# Patient Record
Sex: Male | Born: 1965
Health system: Southern US, Community
[De-identification: ages and names within clinical notes are randomized; demographics above are authoritative.]

## PROBLEM LIST (undated history)

## (undated) DIAGNOSIS — K219 Gastro-esophageal reflux disease without esophagitis: Secondary | ICD-10-CM

## (undated) DIAGNOSIS — R030 Elevated blood-pressure reading, without diagnosis of hypertension: Secondary | ICD-10-CM

## (undated) DIAGNOSIS — H9190 Unspecified hearing loss, unspecified ear: Secondary | ICD-10-CM

## (undated) DIAGNOSIS — E785 Hyperlipidemia, unspecified: Secondary | ICD-10-CM

## (undated) DIAGNOSIS — E291 Testicular hypofunction: Secondary | ICD-10-CM

## (undated) DIAGNOSIS — N400 Enlarged prostate without lower urinary tract symptoms: Secondary | ICD-10-CM

## (undated) DIAGNOSIS — R079 Chest pain, unspecified: Secondary | ICD-10-CM

## (undated) DIAGNOSIS — I10 Essential (primary) hypertension: Secondary | ICD-10-CM

## (undated) DIAGNOSIS — N411 Chronic prostatitis: Secondary | ICD-10-CM

## (undated) DIAGNOSIS — R454 Irritability and anger: Secondary | ICD-10-CM

## (undated) DIAGNOSIS — A0472 Enterocolitis due to Clostridium difficile, not specified as recurrent: Secondary | ICD-10-CM

## (undated) DIAGNOSIS — B019 Varicella without complication: Secondary | ICD-10-CM

## (undated) DIAGNOSIS — G4762 Sleep related leg cramps: Secondary | ICD-10-CM

## (undated) DIAGNOSIS — M47812 Spondylosis without myelopathy or radiculopathy, cervical region: Secondary | ICD-10-CM

## (undated) DIAGNOSIS — S0510XA Contusion of eyeball and orbital tissues, unspecified eye, initial encounter: Secondary | ICD-10-CM

## (undated) DIAGNOSIS — R9431 Abnormal electrocardiogram [ECG] [EKG]: Secondary | ICD-10-CM

## (undated) DIAGNOSIS — M19019 Primary osteoarthritis, unspecified shoulder: Secondary | ICD-10-CM

## (undated) DIAGNOSIS — G4730 Sleep apnea, unspecified: Secondary | ICD-10-CM

## (undated) DIAGNOSIS — G4700 Insomnia, unspecified: Secondary | ICD-10-CM

## (undated) DIAGNOSIS — J309 Allergic rhinitis, unspecified: Secondary | ICD-10-CM

## (undated) DIAGNOSIS — L658 Other specified nonscarring hair loss: Secondary | ICD-10-CM

## (undated) HISTORY — DX: Varicella without complication: B01.9

## (undated) HISTORY — DX: Chest pain, unspecified: R07.9

## (undated) HISTORY — DX: Gastro-esophageal reflux disease without esophagitis: K21.9

## (undated) HISTORY — DX: Irritability and anger: R45.4

## (undated) HISTORY — DX: Testicular hypofunction: E29.1

## (undated) HISTORY — DX: Allergic rhinitis, unspecified: J30.9

## (undated) HISTORY — DX: Unspecified hearing loss, unspecified ear: H91.90

## (undated) HISTORY — DX: Other specified nonscarring hair loss: L65.8

## (undated) HISTORY — DX: Insomnia, unspecified: G47.00

## (undated) HISTORY — DX: Hyperlipidemia, unspecified: E78.5

## (undated) HISTORY — DX: Elevated blood-pressure reading, without diagnosis of hypertension: R03.0

## (undated) HISTORY — DX: Sleep related leg cramps: G47.62

## (undated) HISTORY — DX: Abnormal electrocardiogram (ECG) (EKG): R94.31

## (undated) HISTORY — DX: Contusion of eyeball and orbital tissues, unspecified eye, initial encounter: S05.10XA

## (undated) HISTORY — DX: Benign prostatic hyperplasia without lower urinary tract symptoms: N40.0

## (undated) HISTORY — DX: Chronic prostatitis: N41.1

## (undated) HISTORY — PX: DENTAL RESTORATION/EXTRACTION WITH X-RAY: SHX5796

---

## 1999-10-19 ENCOUNTER — Encounter: Admission: RE | Admit: 1999-10-19 | Discharge: 1999-10-19 | Payer: Self-pay | Admitting: *Deleted

## 2008-10-22 ENCOUNTER — Emergency Department: Payer: Self-pay | Admitting: Emergency Medicine

## 2009-01-31 HISTORY — PX: TONSILLECTOMY: SUR1361

## 2011-01-23 ENCOUNTER — Ambulatory Visit: Payer: Self-pay | Admitting: Family Medicine

## 2012-02-20 LAB — HEMOGLOBIN A1C: Hgb A1c MFr Bld: 5.4 % (ref 4.0–6.0)

## 2012-02-20 LAB — LIPID PANEL
Cholesterol: 174 mg/dL (ref 0–200)
HDL: 43 mg/dL (ref 35–70)
LDL Cholesterol: 109 mg/dL
Triglycerides: 108 mg/dL (ref 40–160)

## 2012-08-04 ENCOUNTER — Encounter: Payer: Self-pay | Admitting: *Deleted

## 2012-08-06 ENCOUNTER — Encounter: Payer: Self-pay | Admitting: *Deleted

## 2012-08-12 ENCOUNTER — Encounter: Payer: Self-pay | Admitting: Family Medicine

## 2012-08-17 ENCOUNTER — Ambulatory Visit (INDEPENDENT_AMBULATORY_CARE_PROVIDER_SITE_OTHER): Payer: 59 | Admitting: Family Medicine

## 2012-08-17 ENCOUNTER — Encounter: Payer: Self-pay | Admitting: Family Medicine

## 2012-08-17 VITALS — BP 132/90 | HR 68 | Temp 98.2°F | Resp 16 | Ht 71.5 in | Wt 228.0 lb

## 2012-08-17 DIAGNOSIS — Z8042 Family history of malignant neoplasm of prostate: Secondary | ICD-10-CM

## 2012-08-17 DIAGNOSIS — E291 Testicular hypofunction: Secondary | ICD-10-CM

## 2012-08-17 DIAGNOSIS — IMO0001 Reserved for inherently not codable concepts without codable children: Secondary | ICD-10-CM

## 2012-08-17 DIAGNOSIS — N400 Enlarged prostate without lower urinary tract symptoms: Secondary | ICD-10-CM

## 2012-08-17 DIAGNOSIS — F419 Anxiety disorder, unspecified: Secondary | ICD-10-CM

## 2012-08-17 DIAGNOSIS — R030 Elevated blood-pressure reading, without diagnosis of hypertension: Secondary | ICD-10-CM

## 2012-08-17 DIAGNOSIS — F411 Generalized anxiety disorder: Secondary | ICD-10-CM

## 2012-08-17 DIAGNOSIS — G4700 Insomnia, unspecified: Secondary | ICD-10-CM

## 2012-08-17 MED ORDER — CLONAZEPAM 0.5 MG PO TABS: 0.5000 mg | ORAL_TABLET | Freq: Every day | ORAL | Status: DC | PRN

## 2012-08-17 NOTE — Progress Notes (Signed)
[address]   [place], [place]  [address]   [phone]  Subjective:    Patient ID: Tom Wilkerson, male    DOB: November 16, 1966, 46 y.o.   MRN: [id number]  HPIThis 46 y.o. male presents for evaluation of  1.  Anxiety:  Feels much better with 1/4 of Clonazepam 0.5mg  daily.  Has a really short fuse; +anger. Still trying not to take it every day.  Wife notices a profound difference in mood when patient takes daily 1/4 Clonazepam.  Wife really wants pt taking med daily.   2. Insomnia: Intolerant to Unisom after last visit.  Sleeps really well with 1/4 of Clonazepam in evenings.   If takes 1/4 Clonazepam at night and then takes 1/4 the following day, a complete Zombie.    3.  Hypogonadism:  feels completely different on injections than pellets.  Severe fatigue without testosterone.  Needs syringes 3 ml x 3.  PSA 1.1 04/2012.  Followed closely by [facility].  4.  Blood pressure elevated:  Sweats excessively; blood pressure at Cope's office runs 170/100.  Gets very nervous at [facility]ope's office.  Associates his office with tremendous pain with pellet injections.  Not exercising over the summer.  Not taking ASA.  5.  Vitamin D deficiency: not taking vitamin D supplement ad advised after CPE in 02/2012.    Review of Systems  Constitutional: Negative for fever, chills, diaphoresis and fatigue.  Respiratory: Negative for shortness of breath.   Cardiovascular: Negative for chest pain, palpitations and leg swelling.  Gastrointestinal: Negative for nausea, vomiting and diarrhea.  Genitourinary: Positive for frequency.  Psychiatric/Behavioral: Positive for disturbed wake/sleep cycle. Negative for suicidal ideas, behavioral problems, self-injury, dysphoric mood, decreased concentration and agitation. The patient is nervous/anxious.         Past Medical History  Diagnosis Date  . Insomnia, unspecified   . Nonspecific abnormal electrocardiogram (ECG) (EKG)   . Unspecified hearing loss   . Other and unspecified  hyperlipidemia   . Esophageal reflux   . Chest pain, unspecified   . Unspecified contusion of eye   . Irritability   . Sleep related leg cramps   . Chronic prostatitis   . Unspecified hyperplasia of prostate without urinary obstruction and other lower urinary tract symptoms (LUTS)   . Other alopecia   . Allergic rhinitis, cause unspecified   . Elevated blood pressure reading without diagnosis of hypertension   . Other testicular hypofunction   . Chicken pox     Past Surgical History  Procedure Date  . Tonsillectomy 01/31/2009    Willeen Cass    Prior to Admission medications   Medication Sig Start Date End Date Taking? Authorizing Provider  budesonide (RHINOCORT AQUA) 32 MCG/ACT nasal spray Place 2 sprays into the nose daily.   Yes Historical Provider, MD  clonazePAM (KLONOPIN) 0.5 MG tablet Take 0.5 mg by mouth daily as needed.   Yes Historical Provider, MD  EPINEPHrine (EPI-PEN) 0.3 mg/0.3 mL DEVI Inject 0.3 mg into the muscle once.   Yes Historical Provider, MD  metaxalone (SKELAXIN) 800 MG tablet Take 800 mg by mouth 4 (four) times daily as needed.   Yes Historical Provider, MD  Tamsulosin HCl (FLOMAX) 0.4 MG CAPS Take by mouth.   Yes Historical Provider, MD  testosterone cypionate (DEPOTESTOTERONE CYPIONATE) 100 MG/ML injection Inject into the muscle every 14 (fourteen) days. For IM use only   Yes Historical Provider, MD    Allergies  Allergen Reactions  . Bee Venom  Bee Stings   . Codeine     Insomnia and Hyperactivity  . Ibuprofen Itching and Swelling    History   Social History  . Marital Status: Married    Spouse Name: N/A    Number of Children: N/A  . Years of Education: N/A   Occupational History  . works from [employer] in [employer] x 12 years; happy   Social History Main Topics  . Smoking status: Never Smoker   . Smokeless tobacco: Not on file  . Alcohol Use: Yes     occasional socially twice weekly beer  . Drug Use: No    . Sexually Active: Not on file   Other Topics Concern  . Not on file   Social History Narrative   Always uses seat belts. Smoke alarm and carbon monoxide detector in the home.Guns in the home stored in locked cabinet. Caffeine use Large more than 8 servings per day. Married x 23 years, happily married,no children.Nutrtion Poorly balanced diet. Exercise: moderate 3 x week, walking,cycling. Pets: cat, dog    Family History  Problem Relation Age of Onset  . Cancer      malignancy prostate  . Hypertension Mother   . Cancer Mother     lung and kidney  . Depression Mother   . Hyperlipidemia Mother   . Arthritis Mother   . Migraines Sister   . Heart disease Father     CAD CABG age 54  . Hypertension Father   . Stroke Father   . Hyperlipidemia Father   . Cancer Father     prostate  . Liver disease Brother     on liver transplant list  . Ulcerative colitis Brother     Objective:   Physical Exam  Nursing note and vitals reviewed. Constitutional: He is oriented to person, place, and time. He appears well-developed and well-nourished. No distress.  Eyes: Conjunctivae normal are normal. Pupils are equal, round, and reactive to light.  Neck: Normal range of motion. Neck supple.  Cardiovascular: Normal rate, regular rhythm and normal heart sounds.   Pulmonary/Chest: Effort normal and breath sounds normal.  Neurological: He is alert and oriented to person, place, and time. No cranial nerve deficit. He exhibits normal muscle tone.  Skin: He is not diaphoretic.  Psychiatric: He has a normal mood and affect. His behavior is normal. Judgment and thought content normal.       Assessment & Plan:   1. Insomnia  clonazePAM (KLONOPIN) 0.5 MG tablet  2. Anxiety  clonazePAM (KLONOPIN) 0.5 MG tablet  3. Blood pressure elevated    4. BPH (benign prostatic hyperplasia)    5. Hypogonadism male    6. Family history of prostate cancer       1. Insomnia:  Controlled with nightly Clonazepam  0.5mg  1/4 daily.  No changes to management. 2.  Anxiety: Improved with 1/4 tablet of daily Clonazepam 0.5mg ; discussed habit forming nature of medication and agreeable to continuing at this time due to low dose needed. 3.  Blood pressure elevated: persistent; to start checking blood pressure weekly at home.  Also to restart exercise regimen. 4.  BPH: stable but warranting Flomax almost daily; managed by urology. 5.  Hypogonadism: stable despite switch from pellets to injections; per urology. 6. Family history of prostate cancer: followed by urology. 7. Vitamin D deficiency: uncontrolled.  Non-compliant with daily supplement.  FACE TO FACE TIME SPENT WITH PATIENT 30 MINUTES WITH 50% OF  TIME DEDICATED TO COUNSELING, COORDINATION OF CARE.

## 2012-08-17 NOTE — Patient Instructions (Addendum)
1. Routine general medical examination at a health care facility    2. Insomnia  clonazePAM (KLONOPIN) 0.5 MG tablet  3. Anxiety  clonazePAM (KLONOPIN) 0.5 MG tablet  4. Blood pressure elevated       CHECK BLOOD PRESSURE WEEKLY AND KEEP RECORD OF READINGS.

## 2012-08-19 ENCOUNTER — Encounter: Payer: Self-pay | Admitting: Family Medicine

## 2012-08-19 DIAGNOSIS — F419 Anxiety disorder, unspecified: Secondary | ICD-10-CM | POA: Insufficient documentation

## 2012-08-19 DIAGNOSIS — Z8042 Family history of malignant neoplasm of prostate: Secondary | ICD-10-CM | POA: Insufficient documentation

## 2012-08-19 DIAGNOSIS — G4700 Insomnia, unspecified: Secondary | ICD-10-CM | POA: Insufficient documentation

## 2012-08-19 DIAGNOSIS — R030 Elevated blood-pressure reading, without diagnosis of hypertension: Secondary | ICD-10-CM | POA: Insufficient documentation

## 2012-08-19 DIAGNOSIS — E291 Testicular hypofunction: Secondary | ICD-10-CM | POA: Insufficient documentation

## 2012-08-19 DIAGNOSIS — N400 Enlarged prostate without lower urinary tract symptoms: Secondary | ICD-10-CM | POA: Insufficient documentation

## 2012-09-16 NOTE — Progress Notes (Signed)
Reviewed and agree.

## 2012-10-14 ENCOUNTER — Telehealth: Payer: Self-pay

## 2012-10-14 NOTE — Telephone Encounter (Signed)
Call back --- Mercie Eon, MD of [facility].  Phone number was not on consult note or fax sheet.  Should be able to call [facility] for phone number.  KMS

## 2012-10-14 NOTE — Telephone Encounter (Signed)
Pt wife calling stating that the patient has a severe headache and was told from past diagnosis that if the pain became a certain number that they needed to call [facility]-they can not remember the name of the person at [facility] that dr Bertrum Sol sent him to originally.  Please call [phone]

## 2012-10-14 NOTE — Telephone Encounter (Signed)
Pt wife advised 

## 2012-10-24 ENCOUNTER — Ambulatory Visit (INDEPENDENT_AMBULATORY_CARE_PROVIDER_SITE_OTHER): Payer: 59 | Admitting: Family Medicine

## 2012-10-24 ENCOUNTER — Ambulatory Visit: Payer: 59

## 2012-10-24 ENCOUNTER — Encounter: Payer: Self-pay | Admitting: Family Medicine

## 2012-10-24 VITALS — BP 117/82 | HR 86 | Temp 98.6°F | Resp 18 | Ht 72.5 in | Wt 221.0 lb

## 2012-10-24 DIAGNOSIS — R059 Cough, unspecified: Secondary | ICD-10-CM

## 2012-10-24 DIAGNOSIS — R51 Headache: Secondary | ICD-10-CM

## 2012-10-24 DIAGNOSIS — M542 Cervicalgia: Secondary | ICD-10-CM

## 2012-10-24 DIAGNOSIS — R509 Fever, unspecified: Secondary | ICD-10-CM

## 2012-10-24 DIAGNOSIS — R079 Chest pain, unspecified: Secondary | ICD-10-CM

## 2012-10-24 DIAGNOSIS — R05 Cough: Secondary | ICD-10-CM

## 2012-10-24 DIAGNOSIS — R0781 Pleurodynia: Secondary | ICD-10-CM

## 2012-10-24 LAB — POCT CBC
Granulocyte percent: 96.2 %G — AB (ref 37–80)
MCH, POC: 29.3 pg (ref 27–31.2)
MCV: 92 fL (ref 80–97)
MID (cbc): 0.5 (ref 0–0.9)
MPV: 9.2 fL (ref 0–99.8)
POC LYMPH PERCENT: 22.3 %L (ref 10–50)
POC MID %: 8.6 %M (ref 0–12)
Platelet Count, POC: 330 10*3/uL (ref 142–424)
RDW, POC: 13.7 %
WBC: 5.8 10*3/uL (ref 4.6–10.2)

## 2012-10-24 LAB — POCT SEDIMENTATION RATE: POCT SED RATE: 2 mm/hr (ref 0–22)

## 2012-10-24 LAB — COMPREHENSIVE METABOLIC PANEL
BUN: 12 mg/dL (ref 6–23)
CO2: 28 mEq/L (ref 19–32)
Calcium: 9.9 mg/dL (ref 8.4–10.5)
Chloride: 99 mEq/L (ref 96–112)
Creat: 1.46 mg/dL — ABNORMAL HIGH (ref 0.50–1.35)

## 2012-10-24 MED ORDER — PREDNISONE 20 MG PO TABS: ORAL_TABLET | ORAL | Status: DC

## 2012-10-24 MED ORDER — CYCLOBENZAPRINE HCL 5 MG PO TABS: 5.0000 mg | ORAL_TABLET | Freq: Three times a day (TID) | ORAL | Status: DC | PRN

## 2012-10-24 NOTE — Progress Notes (Signed)
[address]   [place], [place]  [address]   [phone]  Subjective:    Patient ID: Tom Wilkerson, male    DOB: Sep 27, 1966, 46 y.o.   MRN: [id number]  HPIThis 46 y.o. male presents for evaluation of headache, cold symptoms.  The week before Thanksgiving had horrible sore throat.  ST severe; started coughing dry hacky like crazy.  Coughed so bad one time started retching. Evaluated by Marion Downer; placed on Amoxicillin.  Then headache developed.  Now having muscle pains and stiffness in neck.  Sleeping in odd places.  Muscles in back hurting from coughing.  Unable to sleep at all for 4-5 days.  Severity 2-5 HA.  Headache less than 1 currently.  Scalp has been very sensitive to touch.  Moving really slowly due to back pain.  Scott gave Tramadol for headache.  Taking Tylenol without relief of headache.  No fever with onset;+hot and cold chills; onset this week; four days ago.  HA located B temple regions.  Turning head makes headache worse.  No photophobia.  No phonophobia.  +nausea this morning and this week.  No appetite.  No vomiting.  No diarrhea.  +rhinorrhea x 1 week. Clear drainage.  No nasal congestion.  Cough horrible started three weeks ago; mild cough but much improved.  No SOB.  Having a hard time taking a deep breath due to back pain.  Onset of headache one week ago.  Taking Tramadol which seems to help with pain; Tramadol every 8 hours; last dose last night at 6:00.  Did take Clonazepam this morning.  No blurred vision; no dizziness; no confusion or slowed thoughts.  No syncope.  No n/t/w.  No tick bites in past month.  History of viral meningitis 09/2009 with exacerbation in 01/2010 after tonsillectomy.  S/p consultation by [facility].  Headache feels just like meningitis headache but not as severe.  Very worried about similar headache.  Coughing makes headache worse but straining for B.M. does not worsen headache.  Called neurology last week with onset of headache; would not make  appointment due to time lapse from last visit.  No flu vaccine.     Review of Systems  Constitutional: Positive for fever, chills, diaphoresis and fatigue.  HENT: Positive for congestion, sore throat, rhinorrhea, neck pain, neck stiffness and postnasal drip. Negative for ear pain, trouble swallowing, voice change and sinus pressure.   Respiratory: Positive for cough. Negative for chest tightness, shortness of breath, wheezing and stridor.   Gastrointestinal: Positive for nausea. Negative for vomiting, abdominal pain and diarrhea.  Skin: Negative for rash.  Neurological: Positive for headaches. Negative for dizziness, tremors, seizures, syncope, facial asymmetry, speech difficulty, weakness, light-headedness and numbness.  Psychiatric/Behavioral: Positive for sleep disturbance. Negative for confusion. The patient is nervous/anxious.         Past Medical History  Diagnosis Date  . Insomnia, unspecified   . Nonspecific abnormal electrocardiogram (ECG) (EKG)   . Unspecified hearing loss   . Other and unspecified hyperlipidemia   . Esophageal reflux   . Chest pain, unspecified   . Unspecified contusion of eye   . Irritability   . Sleep related leg cramps   . Chronic prostatitis   . Unspecified hyperplasia of prostate without urinary obstruction and other lower urinary tract symptoms (LUTS)   . Other alopecia   . Allergic rhinitis, cause unspecified   . Elevated blood pressure reading without diagnosis of hypertension   . Other testicular hypofunction   .  Chicken pox     Past Surgical History  Procedure Date  . Tonsillectomy 01/31/2009    Willeen Cass    Prior to Admission medications   Medication Sig Start Date End Date Taking? Authorizing Provider  amoxicillin (AMOXIL) 200 MG/5ML suspension Take by mouth 2 (two) times daily.   Yes Historical Provider, MD  budesonide (RHINOCORT AQUA) 32 MCG/ACT nasal spray Place 2 sprays into the nose daily.   Yes Historical Provider, MD    clonazePAM (KLONOPIN) 0.5 MG tablet Take 1 tablet (0.5 mg total) by mouth daily as needed. 08/17/12  Yes Ethelda Chick, MD  EPINEPHrine (EPI-PEN) 0.3 mg/0.3 mL DEVI Inject 0.3 mg into the muscle once.   Yes Historical Provider, MD  metaxalone (SKELAXIN) 800 MG tablet Take 800 mg by mouth 4 (four) times daily as needed.   Yes Historical Provider, MD  Tamsulosin HCl (FLOMAX) 0.4 MG CAPS Take by mouth.   Yes Historical Provider, MD  testosterone cypionate (DEPOTESTOTERONE CYPIONATE) 100 MG/ML injection Inject into the muscle every 14 (fourteen) days. For IM use only   Yes Historical Provider, MD    Allergies  Allergen Reactions  . Bee Venom     Bee Stings   . Codeine     Insomnia and Hyperactivity  . Ibuprofen Itching and Swelling    History   Social History  . Marital Status: Married    Spouse Name: N/A    Number of Children: N/A  . Years of Education: N/A   Occupational History  . works from [employer] company in [employer] x 12 years; happy   Social History Main Topics  . Smoking status: Never Smoker   . Smokeless tobacco: Not on file  . Alcohol Use: Yes     Comment: occasional socially twice weekly beer  . Drug Use: No  . Sexually Active: Not on file   Other Topics Concern  . Not on file   Social History Narrative   Always uses seat belts. Smoke alarm and carbon monoxide detector in the home.Guns in the home stored in locked cabinet. Caffeine use Large more than 8 servings per day. Married x 23 years, happily married,no children.Nutrtion Poorly balanced diet. Exercise: moderate 3 x week, walking,cycling. Pets: cat, dog    Family History  Problem Relation Age of Onset  . Cancer      malignancy prostate  . Hypertension Mother   . Cancer Mother     lung and kidney  . Depression Mother   . Hyperlipidemia Mother   . Arthritis Mother   . Migraines Sister   . Heart disease Father     CAD CABG age 50  . Hypertension Father   . Stroke Father   .  Hyperlipidemia Father   . Cancer Father     prostate  . Liver disease Brother     on liver transplant list  . Ulcerative colitis Brother     Objective:   Physical Exam  Nursing note and vitals reviewed. Constitutional: He is oriented to person, place, and time. He appears well-developed and well-nourished. No distress.  HENT:  Head: Normocephalic and atraumatic.  Right Ear: External ear normal.  Left Ear: External ear normal.  Nose: Nose normal.  Mouth/Throat: Oropharynx is clear and moist. No oropharyngeal exudate.  Eyes: Conjunctivae normal and EOM are normal. Pupils are equal, round, and reactive to light.  Neck: Neck supple. Muscular tenderness present. No spinous process tenderness present. No rigidity. Decreased range  of motion present. No thyromegaly present.  Cardiovascular: Normal rate, regular rhythm and normal heart sounds.   No murmur heard. Pulmonary/Chest: Effort normal and breath sounds normal. He has no wheezes. He has no rales.  Musculoskeletal:       Cervical back: He exhibits decreased range of motion, tenderness, pain and spasm. He exhibits no bony tenderness and no swelling.       Thoracic back: He exhibits decreased range of motion, tenderness, pain and spasm. He exhibits no bony tenderness.  Lymphadenopathy:    He has no cervical adenopathy.  Neurological: He is alert and oriented to person, place, and time. He has normal reflexes. He displays no tremor. No cranial nerve deficit or sensory deficit. He exhibits normal muscle tone. Coordination normal.       No nuchal rigidity.  Skin: He is not diaphoretic.  Psychiatric: He has a normal mood and affect. His behavior is normal. Judgment and thought content normal.    [facility] reading (PRIMARY) by  Dr. Katrinka Blazing.  CXR: NAD.  C-spine: NAD; straightening; loss of normal curvature.  R ribs: NAD.  Results for orders placed in visit on 10/24/12  POCT CBC      Component Value Range   WBC 5.8  4.6 - 10.2 K/uL   Lymph, poc  1.3  0.6 - 3.4   POC LYMPH PERCENT 22.3  10 - 50 %L   MID (cbc) 0.5  0 - 0.9   POC MID % 8.6  0 - 12 %M   POC Granulocyte 4.0  2 - 6.9   Granulocyte percent 96.2 (*) 37 - 80 %G   RBC 6.15 (*) 4.69 - 6.13 M/uL   Hemoglobin 18.0  14.1 - 18.1 g/dL   HCT, POC 40.9 (*) 81.1 - 53.7 %   MCV 92.0  80 - 97 fL   MCH, POC 29.3  27 - 31.2 pg   MCHC 31.8  31.8 - 35.4 g/dL   RDW, POC 91.4     Platelet Count, POC 330  142 - 424 K/uL   MPV 9.2  0 - 99.8 fL  GLUCOSE, POCT (MANUAL RESULT ENTRY)      Component Value Range   POC Glucose 91  70 - 99 mg/dl  POCT INFLUENZA A/B      Component Value Range   Influenza A, POC Negative     Influenza B, POC Negative         Assessment & Plan:   1. Fever  POCT CBC, POCT Influenza A/B  2. Cough  POCT CBC, POCT Influenza A/B, POCT SEDIMENTATION RATE, DG Chest 2 View  3. Headache  POCT glucose (manual entry), Comprehensive metabolic panel, Ambulatory referral to Neurology  4. Neck pain  DG Cervical Spine 2-3 Views  5. Rib pain on right side  DG Ribs Unilateral W/Chest Right     1. Fever: New onset in past week.  Influenza swab negative; WBC count normal consistent with viral syndrome.  CXR negative. 2.  Cough:  Onset two weeks ago; 75% improved.  CXR negative. 3.  Headache: New onset in past week. Normal neurological exam.  History of viral meningitis in 2010 and current headache milder but similar to meningitis mediated HA.  Refer to neurology now for evaluation in upcoming 2-3 weeks if persists.  Can continue Ultram 50mg  1-2 every eight hours PRN.  Ddx viral etiology, early viral meningitis, neurological process.  If worsens, to ED.  Continue Skelaxin during daytime hours; Flexeril qhs.   4.  Neck pain/strain:  New.  Associated with above symptoms.  S/p C-spine films negative.  Treat with Prednisone, Skelaxin, Flexeril, Ultram.   5.  R rib pain/strain:  New.  Secondary to cough in past two weeks.  Rx for Prednisone, Flexeril, Skelaxin, Ultram.    Meds  ordered this encounter  Medications  . amoxicillin (AMOXIL) 200 MG/5ML suspension    Sig: Take by mouth 2 (two) times daily.  . cyclobenzaprine (FLEXERIL) 5 MG tablet    Sig: Take 1 tablet (5 mg total) by mouth 3 (three) times daily as needed for muscle spasms.    Dispense:  30 tablet    Refill:  0  . predniSONE (DELTASONE) 20 MG tablet    Sig: Three tablets daily x 1 day, then two tablets daily x 5 days, then one tablet daily x 5 days    Dispense:  18 tablet    Refill:  0

## 2012-10-24 NOTE — Patient Instructions (Addendum)
1. Fever  POCT CBC, POCT Influenza A/B  2. Cough  POCT CBC, POCT Influenza A/B, POCT SEDIMENTATION RATE, DG Chest 2 View  3. Headache  POCT glucose (manual entry), Comprehensive metabolic panel, Ambulatory referral to Neurology  4. Neck pain  DG Cervical Spine 2-3 Views  5. Rib pain on right side  DG Ribs Unilateral W/Chest Right

## 2012-10-26 NOTE — Progress Notes (Signed)
Reviewed and agree.

## 2012-11-02 NOTE — Progress Notes (Signed)
Tried to call pt to schedule f-up appt with Dr. Katrinka Blazing. Cell # has been disconnected and home# has no machine. Will continue trying.Tom Wilkerson

## 2012-11-04 NOTE — Progress Notes (Signed)
Still unable to reach patient by phone, reminder letter sent to schedule follow-up appt with Dr. Katrinka Blazing. Tom Wilkerson

## 2013-01-18 ENCOUNTER — Other Ambulatory Visit: Payer: Self-pay | Admitting: Radiology

## 2013-01-18 NOTE — Telephone Encounter (Signed)
[facility] called for WC information called [facility] and they have helped with this.

## 2013-01-19 MED ORDER — CYCLOBENZAPRINE HCL 5 MG PO TABS: 5.0000 mg | ORAL_TABLET | Freq: Three times a day (TID) | ORAL | Status: DC | PRN

## 2013-02-10 ENCOUNTER — Telehealth: Payer: Self-pay

## 2013-02-10 NOTE — Telephone Encounter (Signed)
Pharm requests RF of cyclobenzaprine 5 mg. Do you want to RF this?

## 2013-02-11 MED ORDER — CYCLOBENZAPRINE HCL 5 MG PO TABS: 5.0000 mg | ORAL_TABLET | Freq: Three times a day (TID) | ORAL | Status: DC | PRN

## 2013-02-11 NOTE — Telephone Encounter (Signed)
Call pt-- provided with refill on Flexeril.  How often is he needing Flexeril?  Is it for persistent neck pain?

## 2013-02-11 NOTE — Telephone Encounter (Signed)
He is taking this for his head and neck, tension headache this completely relieves his pain. He only takes prn.

## 2013-02-11 NOTE — Telephone Encounter (Signed)
Called patient. Left message for him to call me back.

## 2013-02-11 NOTE — Telephone Encounter (Signed)
Noted. KMS

## 2013-03-22 ENCOUNTER — Encounter: Payer: 59 | Admitting: Family Medicine

## 2013-08-03 ENCOUNTER — Encounter: Payer: Self-pay | Admitting: *Deleted

## 2013-08-09 ENCOUNTER — Institutional Professional Consult (permissible substitution): Payer: Self-pay | Admitting: Cardiology

## 2013-09-03 ENCOUNTER — Encounter: Payer: Self-pay | Admitting: *Deleted

## 2013-09-07 ENCOUNTER — Ambulatory Visit (INDEPENDENT_AMBULATORY_CARE_PROVIDER_SITE_OTHER): Payer: 59 | Admitting: Cardiology

## 2013-09-07 ENCOUNTER — Encounter: Payer: Self-pay | Admitting: Cardiology

## 2013-09-07 VITALS — BP 142/80 | HR 78 | Wt 218.0 lb

## 2013-09-07 DIAGNOSIS — R079 Chest pain, unspecified: Secondary | ICD-10-CM

## 2013-09-07 LAB — COMPREHENSIVE METABOLIC PANEL
ALT: 19 U/L (ref 0–53)
AST: 18 U/L (ref 0–37)
Albumin: 4.2 g/dL (ref 3.5–5.2)
Alkaline Phosphatase: 73 U/L (ref 39–117)
BUN: 13 mg/dL (ref 6–23)
CO2: 31 mEq/L (ref 19–32)
Calcium: 9.1 mg/dL (ref 8.4–10.5)
Chloride: 99 mEq/L (ref 96–112)
Creatinine, Ser: 1.3 mg/dL (ref 0.4–1.5)
GFR: 61.44 mL/min (ref >60.00)
Glucose, Bld: 98 mg/dL (ref 70–99)
Potassium: 4.1 mEq/L (ref 3.5–5.1)
Sodium: 138 mEq/L (ref 135–145)
Total Bilirubin: 0.7 mg/dL (ref 0.3–1.2)
Total Protein: 7 g/dL (ref 6.0–8.3)

## 2013-09-07 NOTE — Progress Notes (Signed)
Patient ID: JYLAN LOEZA, male   DOB: 08-Jan-1966, 47 y.o.   MRN: [id number]    Patient Name: Tom Wilkerson Date of Encounter: 09/07/2013  Primary Care Provider:  Vonita Moss, MD Primary Cardiologist:  Tobias Alexander, H   Patient Profile  Chest  Problem List   Past Medical History  Diagnosis Date  . Insomnia, unspecified   . Nonspecific abnormal electrocardiogram (ECG) (EKG)   . Unspecified hearing loss   . Other and unspecified hyperlipidemia   . Esophageal reflux   . Chest pain, unspecified   . Unspecified contusion of eye   . Irritability   . Sleep related leg cramps   . Chronic prostatitis   . Unspecified hyperplasia of prostate without urinary obstruction and other lower urinary tract symptoms (LUTS)   . Other alopecia   . Allergic rhinitis, cause unspecified   . Elevated blood pressure reading without diagnosis of hypertension   . Other testicular hypofunction   . Chicken pox    Past Surgical History  Procedure Laterality Date  . Tonsillectomy  01/31/2009    [name]    Allergies  Allergies  Allergen Reactions  . Bee Venom     Bee Stings   . Codeine     Insomnia and Hyperactivity  . Ibuprofen Itching and Swelling    HPI  A 47 year old gentleman with uncontrolled hypertension he was a former avid biker. The patient used to try to bike for 5016 miles until 10 months ago when he injured his back. In the last 3 months he started to experience nonexertional retrosternal chest pain. The pain is not associated with any aggravating situation, no obvious alleviating factors. The pain is pressure-like retrosternal no radiation no other associated symptoms. The patient was seen by his primary care physician who prescribed Zantac that gave him no relief. The patient underwent stress testing in  about 4 years ago that was negative at the time. The patient brings a blood pressure diary with him. He was recently started on lisinopril 5 mg daily. He is  currently being treated for sinus infection with multiple medication containing ephedrine and so his blood pressure are elevated in 140-150 range. His lisinopril was just increased to 10 mg daily.  Home Medications  Prior to Admission medications   Medication Sig Start Date End Date Taking? Authorizing Provider  budesonide (RHINOCORT AQUA) 32 MCG/ACT nasal spray Place 2 sprays into the nose daily.   Yes Historical Provider, MD  cefdinir (OMNICEF) 300 MG capsule As directed 09/02/13  Yes Historical Provider, MD  clonazePAM (KLONOPIN) 0.5 MG tablet Take 1 tablet (0.5 mg total) by mouth daily as needed. 08/17/12  Yes Ethelda Chick, MD  cyclobenzaprine (FLEXERIL) 5 MG tablet Take 1 tablet (5 mg total) by mouth 3 (three) times daily as needed for muscle spasms. 02/11/13  Yes Ethelda Chick, MD  EPINEPHrine (EPI-PEN) 0.3 mg/0.3 mL DEVI Inject 0.3 mg into the muscle once.   Yes Historical Provider, MD  lisinopril (PRINIVIL,ZESTRIL) 5 MG tablet Take 2 tablets by mouth daily. 07/26/13  Yes Historical Provider, MD  metaxalone (SKELAXIN) 800 MG tablet Take 800 mg by mouth 4 (four) times daily as needed.   Yes Historical Provider, MD  pravastatin (PRAVACHOL) 20 MG tablet Take 1 tablet by mouth daily. 07/27/13  Yes Historical Provider, MD  predniSONE (DELTASONE) 20 MG tablet Three tablets daily x 1 day, then two tablets daily x 5 days, then one tablet daily x 5 days 10/24/12  Yes Ethelda Chick,  MD  Tamsulosin HCl (FLOMAX) 0.4 MG CAPS Take by mouth.   Yes Historical Provider, MD  testosterone cypionate (DEPOTESTOTERONE CYPIONATE) 100 MG/ML injection Inject into the muscle every 14 (fourteen) days. For IM use only   Yes Historical Provider, MD    Family History  Family History  Problem Relation Age of Onset  . Cancer      malignancy prostate  . Hypertension Mother   . Lung cancer Mother   . Depression Mother   . Hyperlipidemia Mother   . Arthritis Mother   . Migraines Sister   . CAD Father     CABG age  64  . Hypertension Father   . Stroke Father   . Hyperlipidemia Father   . Prostate cancer Father   . Liver disease Brother     on liver transplant list  . Ulcerative colitis Brother   . Kidney cancer Mother   . Congestive Heart Failure Father     Social History  History   Social History  . Marital Status: Married    Spouse Name: N/A    Number of Children: N/A  . Years of Education: N/A   Occupational History  . works from [employer] company in [employer] x 12 years; happy   Social History Main Topics  . Smoking status: Never Smoker   . Smokeless tobacco: Not on file  . Alcohol Use: Yes     Comment: occasional socially twice weekly beer  . Drug Use: No  . Sexual Activity: Not on file   Other Topics Concern  . Not on file   Social History Narrative   Always uses seat belts. Smoke alarm and carbon monoxide detector in the home.Guns in the home stored in locked cabinet. Caffeine use Large more than 8 servings per day. Married x 23 years, happily married,no children.Nutrtion Poorly balanced diet. Exercise: moderate 3 x week, walking,cycling. Pets: cat, dog     Review of Systems General:  No chills, fever, night sweats or weight changes.  Cardiovascular:  No chest pain, dyspnea on exertion, edema, orthopnea, palpitations, paroxysmal nocturnal dyspnea. Dermatological: No rash, lesions/masses Respiratory: No cough, dyspnea Urologic: No hematuria, dysuria Abdominal:   No nausea, vomiting, diarrhea, bright red blood per rectum, melena, or hematemesis Neurologic:  No visual changes, wkns, changes in mental status. All other systems reviewed and are otherwise negative except as noted above.  Physical Exam  Blood pressure 142/80, pulse 78, weight 218 lb (98.884 kg).  General: Pleasant, NAD Psych: Normal affect. Neuro: Alert and oriented X 3. Moves all extremities spontaneously. HEENT: Normal  Neck: Supple without bruits or JVD. Lungs:  Resp regular and  unlabored, CTA. Heart: RRR no s3, s4, or murmurs. Abdomen: Soft, non-tender, non-distended, BS + x 4.  Extremities: No clubbing, cyanosis or edema. DP/PT/Radials 2+ and equal bilaterally.  Accessory Clinical Findings  ECG - normal sinus rhythm, rightward axis, normal EKG   Assessment & Plan  47 year old male with hypertension   1. Chest pain - the patient chest pain is rather atypical, he has trace factors including hypertension hyperlipidemia as well as family history. His father and underwent five-vessel bypass at the age of 50. We will order cardiac CT to evaluate his coronary arteries. It will include a calcium score. Check patient's comprehensive metabolic profile to evaluate for his liver enzymes as he was recently started on statins as well as for his kidney function prior to use of IV contrast.  2. Hypertension-  agree with increasing her lisinopril to 10 mg daily and re-evaluate in 3 weeks when patient comes back when he's off efedrin.  3. Hyperlipidemia - followed by PCP - recently started on Pravastatin, we will check liver enzymes  Follow up in 3 weeks   Lars Masson, MD  09/07/2013, 12:09 PM

## 2013-09-07 NOTE — Patient Instructions (Signed)
Lab work today ( CMP )   Schedule cardiac ct with a calcium score  Dr.Nelson wants patient to have done 09/16/13.    Your physician recommends that you schedule a follow-up appointment in: 3 weeks

## 2013-09-08 ENCOUNTER — Encounter: Payer: Self-pay | Admitting: *Deleted

## 2013-09-13 ENCOUNTER — Ambulatory Visit: Payer: Self-pay | Admitting: Family Medicine

## 2013-09-14 ENCOUNTER — Other Ambulatory Visit (HOSPITAL_COMMUNITY): Payer: 59

## 2013-09-16 ENCOUNTER — Ambulatory Visit (HOSPITAL_COMMUNITY): Admission: RE | Admit: 2013-09-16 | Payer: 59 | Source: Ambulatory Visit

## 2013-09-24 ENCOUNTER — Telehealth: Payer: Self-pay | Admitting: Cardiology

## 2013-09-24 NOTE — Telephone Encounter (Signed)
New problem   Is needing nurse to call pt with results of his stress test and his creatine on level 09/15/13 was 1.23.

## 2013-09-24 NOTE — Telephone Encounter (Signed)
Chart was reviewed/ please read lab note dated 09/07/13 [facility]. Will forward to Dr Delton See to call or advise.

## 2013-09-26 NOTE — Telephone Encounter (Signed)
HI Tom Wilkerson, This patient has elevated Crea and therefore cardiac CT is not ideal. I would rather perform an exercise MIBI (nuclear) test. He called on Friday about results of his stress test, but I don't think that it was done. Could you verify and order a new test? I would also change his Lisinopril to amlodipine 5 mg PO daily. Would you call him? Thank you so much, Aris Lot

## 2013-09-27 NOTE — Telephone Encounter (Signed)
**Note De-Identified Tom Wilkerson Obfuscation** Pt is requesting a call from Dr Delton See to discuss tests.  The pt states that he has had a cold for the past month and he feels that may be why his creatine level is high at 1.46. He is concerned that a exercise stress test will cost more than a cardiac CT.

## 2013-09-27 NOTE — Telephone Encounter (Signed)
**Note De-identified Tom Wilkerson Obfuscation** LMTCB

## 2013-09-27 NOTE — Telephone Encounter (Signed)
Almira Coaster,  Would you be able to find out the cost for the cardiac CT and nuclear stress test and call this patient [facility])?  Thank you,  Tobias Alexander

## 2013-09-28 ENCOUNTER — Other Ambulatory Visit: Payer: Self-pay | Admitting: Urgent Care

## 2013-09-28 NOTE — Telephone Encounter (Signed)
**Note De-Identified Tom Wilkerson Obfuscation** Pt request a call from Dr Delton See.

## 2013-09-29 LAB — CLOSTRIDIUM DIFFICILE(ARMC)

## 2013-09-30 NOTE — Telephone Encounter (Signed)
I will forward this to Danielle Dess in [employer]

## 2013-09-30 NOTE — Telephone Encounter (Signed)
This needs to be referred to Billing. I will forward this on. Thanks!

## 2013-10-04 ENCOUNTER — Other Ambulatory Visit: Payer: Self-pay | Admitting: Urgent Care

## 2013-10-04 ENCOUNTER — Ambulatory Visit: Payer: 59 | Admitting: Cardiology

## 2013-10-04 LAB — URINALYSIS, COMPLETE
Bacteria: NONE SEEN
Bilirubin,UR: NEGATIVE
Blood: NEGATIVE
Glucose,UR: NEGATIVE mg/dL (ref 0–75)
Leukocyte Esterase: NEGATIVE
Nitrite: NEGATIVE
Ph: 7 (ref 4.5–8.0)
WBC UR: <1 /HPF (ref 0–5)

## 2013-10-04 LAB — COMPREHENSIVE METABOLIC PANEL
Anion Gap: 2 — ABNORMAL LOW (ref 7–16)
BUN: 5 mg/dL — ABNORMAL LOW (ref 7–18)
Bilirubin,Total: 0.5 mg/dL (ref 0.2–1.0)
Chloride: 100 mmol/L (ref 98–107)
Co2: 35 mmol/L — ABNORMAL HIGH (ref 21–32)
EGFR (African American): >60
EGFR (Non-African Amer.): >60
SGPT (ALT): 15 U/L (ref 12–78)
Total Protein: 6.9 g/dL (ref 6.4–8.2)

## 2013-10-04 LAB — CBC WITH DIFFERENTIAL/PLATELET
Basophil %: 1.2 %
Eosinophil #: 0.2 10*3/uL (ref 0.0–0.7)
Eosinophil %: 3.5 %
HCT: 48.7 % (ref 40.0–52.0)
HGB: 16.6 g/dL (ref 13.0–18.0)
Lymphocyte %: 24.1 %
MCH: 29.2 pg (ref 26.0–34.0)
MCHC: 34.1 g/dL (ref 32.0–36.0)
MCV: 86 fL (ref 80–100)
Monocyte #: 1.1 x10 3/mm — ABNORMAL HIGH (ref 0.2–1.0)
Monocyte %: 19.8 %
Neutrophil #: 2.8 10*3/uL (ref 1.4–6.5)
Neutrophil %: 51.4 %
Platelet: 291 10*3/uL (ref 150–440)
RBC: 5.68 10*6/uL (ref 4.40–5.90)
RDW: 13.7 % (ref 11.5–14.5)
WBC: 5.4 10*3/uL (ref 3.8–10.6)

## 2013-10-28 ENCOUNTER — Ambulatory Visit: Payer: Self-pay | Admitting: Unknown Physician Specialty

## 2013-10-29 LAB — PATHOLOGY REPORT

## 2014-01-20 ENCOUNTER — Telehealth: Payer: Self-pay | Admitting: Family Medicine

## 2014-01-20 NOTE — Telephone Encounter (Signed)
Patient wrote requesting to reestablish care; please call to schedule appointment in upcoming months.  Will need 30 minute appointment slot.

## 2014-01-21 NOTE — Telephone Encounter (Signed)
Called to inform patient that its okay to see Doctor Tamala Julian as a new patient stated he will call back  After he check his schedule

## 2014-07-18 ENCOUNTER — Encounter: Payer: Self-pay | Admitting: Family Medicine

## 2014-07-19 ENCOUNTER — Encounter: Payer: Self-pay | Admitting: Family Medicine

## 2014-08-18 ENCOUNTER — Encounter: Payer: Self-pay | Admitting: Family Medicine

## 2014-09-18 ENCOUNTER — Encounter: Payer: Self-pay | Admitting: Family Medicine

## 2014-12-14 IMAGING — CR DG CHEST 2V
1 series · 2 of 2 positions shown · non-contrast
Comparison: none

REASON FOR EXAM: cough
COMMENTS:

PROCEDURE:     DON LOLITO - DON LOLITO CHEST PA (OR AP) AND LAT  - September 13, 2013  [DATE]
RESULT:     The lungs are clear. The cardiac silhouette and visualized bony
skeleton are unremarkable.

[Series 1: pa · 0.17mm/px · 2 of 2 slices shown]
[im 1/2]
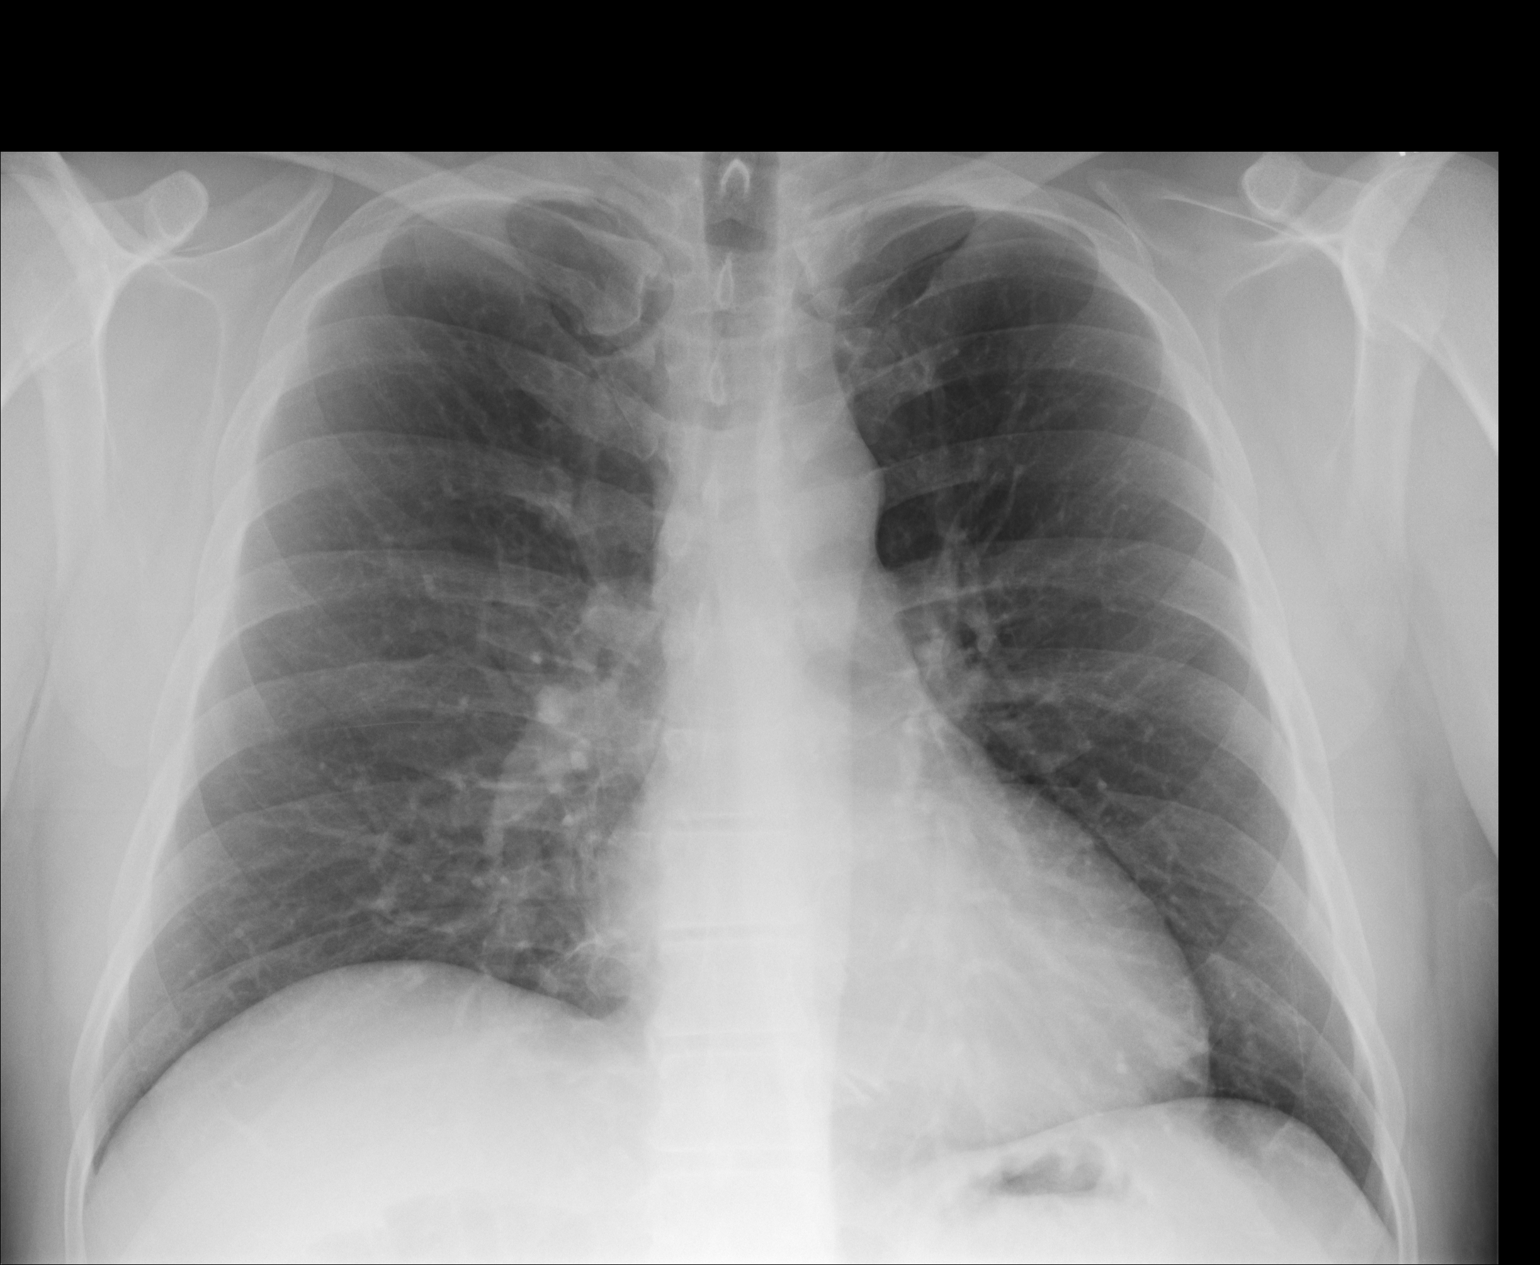
[im 2/2]
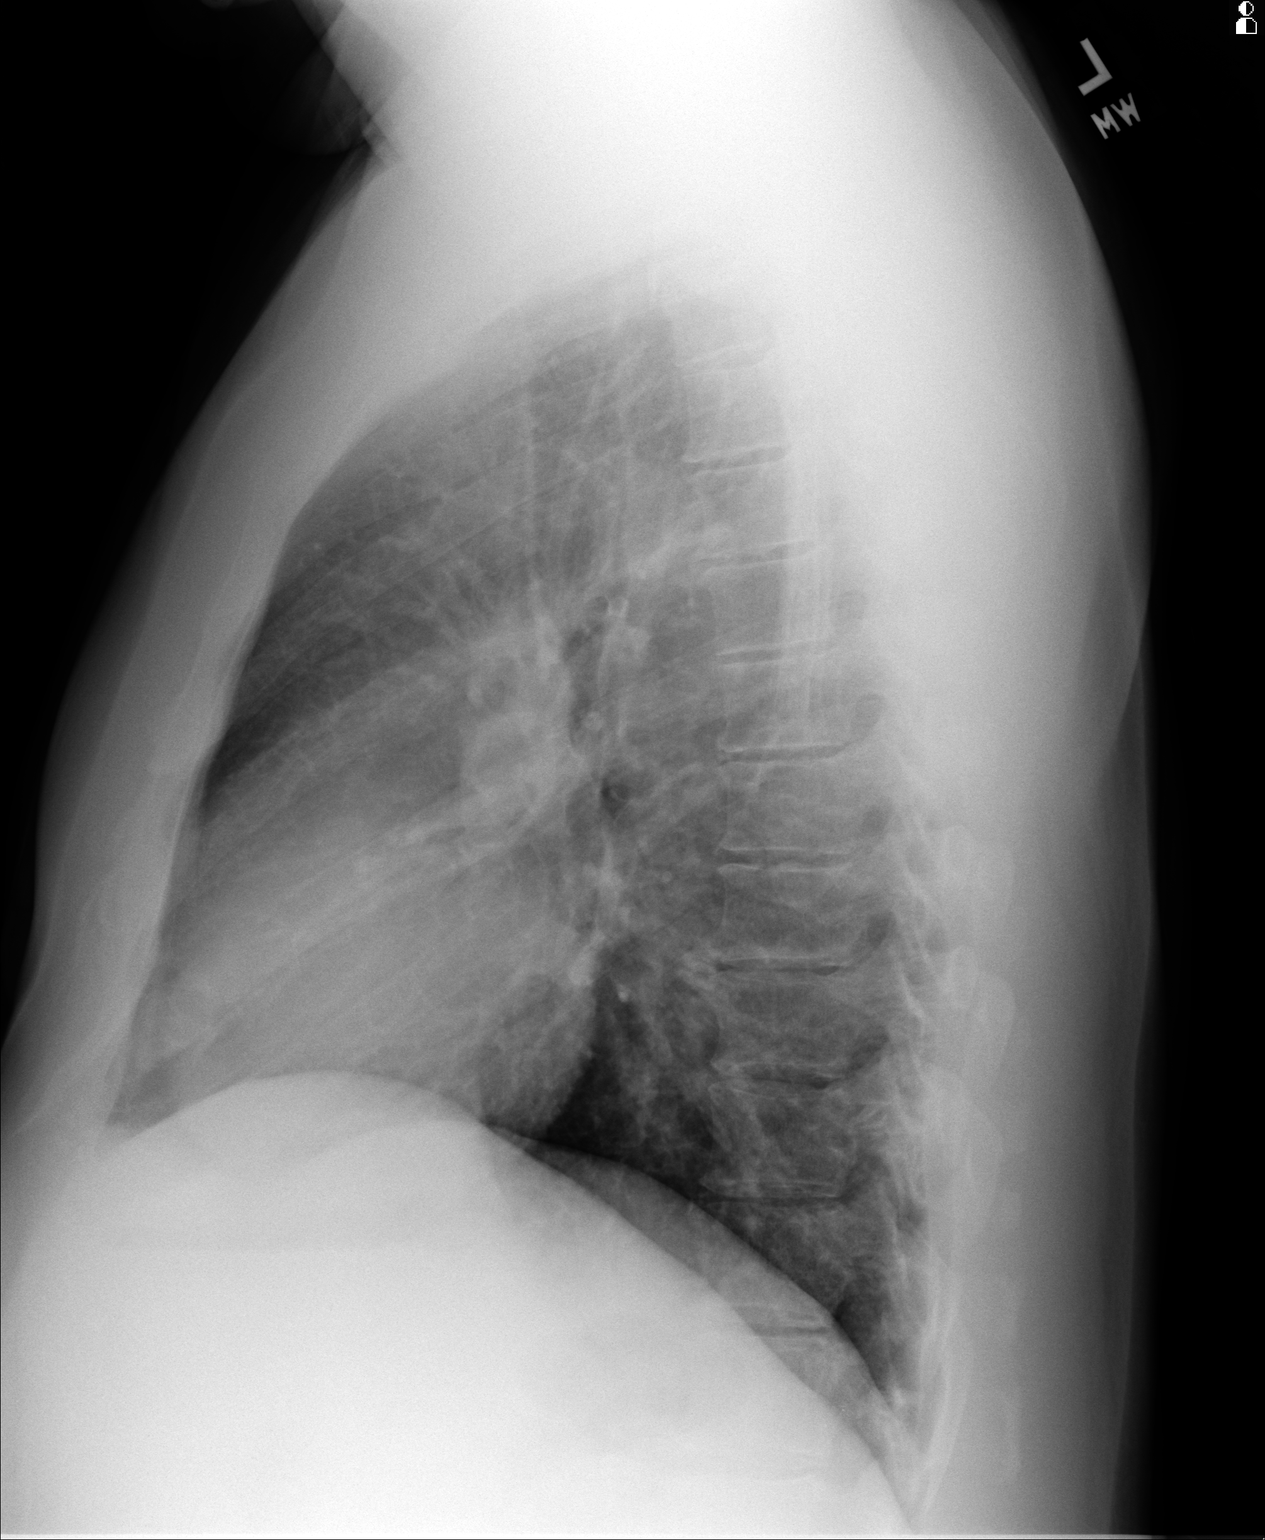

[2 of 2 positions shown; findings below may reference images not displayed]

IMPRESSION: 1. Chest radiograph without evidence of acute cardiopulmonary disease.
2. Comparison 01/23/2011

## 2014-12-19 ENCOUNTER — Ambulatory Visit: Payer: Self-pay

## 2015-06-02 ENCOUNTER — Other Ambulatory Visit: Payer: Self-pay | Admitting: Family Medicine

## 2015-06-02 DIAGNOSIS — Z5181 Encounter for therapeutic drug level monitoring: Secondary | ICD-10-CM | POA: Insufficient documentation

## 2015-06-02 DIAGNOSIS — N182 Chronic kidney disease, stage 2 (mild): Secondary | ICD-10-CM | POA: Insufficient documentation

## 2015-06-02 DIAGNOSIS — E785 Hyperlipidemia, unspecified: Secondary | ICD-10-CM | POA: Insufficient documentation

## 2015-06-02 NOTE — Telephone Encounter (Signed)
Patient notified, lab appointment scheduled

## 2015-06-02 NOTE — Telephone Encounter (Signed)
Please ask patient to come by for some fasting labs Fasting lipid panel with particle size and number (NMR) and CMP

## 2015-06-09 ENCOUNTER — Other Ambulatory Visit: Payer: Self-pay

## 2015-06-12 ENCOUNTER — Other Ambulatory Visit: Payer: Self-pay

## 2015-06-12 DIAGNOSIS — Z5181 Encounter for therapeutic drug level monitoring: Secondary | ICD-10-CM

## 2015-06-12 DIAGNOSIS — E785 Hyperlipidemia, unspecified: Secondary | ICD-10-CM

## 2015-06-13 ENCOUNTER — Encounter: Payer: Self-pay | Admitting: Family Medicine

## 2015-06-13 DIAGNOSIS — E785 Hyperlipidemia, unspecified: Secondary | ICD-10-CM

## 2015-06-13 DIAGNOSIS — R7301 Impaired fasting glucose: Secondary | ICD-10-CM | POA: Insufficient documentation

## 2015-06-13 LAB — COMPREHENSIVE METABOLIC PANEL
ALBUMIN: 4.4 g/dL (ref 3.5–5.5)
ALT: 16 IU/L (ref 0–44)
AST: 12 IU/L (ref 0–40)
Albumin/Globulin Ratio: 2.1 (ref 1.1–2.5)
Alkaline Phosphatase: 88 IU/L (ref 39–117)
BILIRUBIN TOTAL: 0.7 mg/dL (ref 0.0–1.2)
BUN/Creatinine Ratio: 9 (ref 9–20)
BUN: 12 mg/dL (ref 6–24)
CO2: 25 mmol/L (ref 18–29)
Calcium: 9.4 mg/dL (ref 8.7–10.2)
Chloride: 96 mmol/L — ABNORMAL LOW (ref 97–108)
Creatinine, Ser: 1.4 mg/dL — ABNORMAL HIGH (ref 0.76–1.27)
GFR calc non Af Amer: 58 mL/min/{1.73_m2} — ABNORMAL LOW (ref >59)
GFR, EST AFRICAN AMERICAN: 67 mL/min/{1.73_m2} (ref >59)
Globulin, Total: 2.1 g/dL (ref 1.5–4.5)
Glucose: 109 mg/dL — ABNORMAL HIGH (ref 65–99)
Potassium: 3.9 mmol/L (ref 3.5–5.2)
SODIUM: 141 mmol/L (ref 134–144)
Total Protein: 6.5 g/dL (ref 6.0–8.5)

## 2015-06-13 LAB — NMR, LIPOPROFILE
CHOLESTEROL: 144 mg/dL (ref 100–199)
HDL Cholesterol by NMR: 36 mg/dL — ABNORMAL LOW (ref >39)
HDL Particle Number: 28.1 umol/L — ABNORMAL LOW (ref >=30.5)
LDL Particle Number: 970 nmol/L (ref <1000)
LDL Size: 21 nm (ref >20.5)
LDL-C: 85 mg/dL (ref 0–99)
LP-IR SCORE: 49 — AB (ref <=45)
SMALL LDL PARTICLE NUMBER: 167 nmol/L (ref <=527)
TRIGLYCERIDES BY NMR: 113 mg/dL (ref 0–149)

## 2015-06-13 MED ORDER — PRAVASTATIN SODIUM 20 MG PO TABS: 20.0000 mg | ORAL_TABLET | Freq: Every day | ORAL | Status: DC

## 2015-10-16 ENCOUNTER — Other Ambulatory Visit: Payer: Self-pay

## 2015-10-16 NOTE — Telephone Encounter (Signed)
LAST VISIT: 12/29/2014 NEXT APPT: 10/18/2015  Request for Dymista. Rx was not under current med list.   PRACTICE PARTNER: [id number]

## 2015-10-17 MED ORDER — AZELASTINE-FLUTICASONE 137-50 MCG/ACT NA SUSP: 1.0000 | Freq: Two times a day (BID) | NASAL | Status: DC

## 2015-10-18 ENCOUNTER — Ambulatory Visit (INDEPENDENT_AMBULATORY_CARE_PROVIDER_SITE_OTHER): Payer: 59 | Admitting: Family Medicine

## 2015-10-18 ENCOUNTER — Encounter: Payer: Self-pay | Admitting: Family Medicine

## 2015-10-18 ENCOUNTER — Telehealth: Payer: Self-pay | Admitting: Family Medicine

## 2015-10-18 VITALS — BP 136/86 | HR 89 | Temp 98.5°F | Ht 71.5 in | Wt 226.0 lb

## 2015-10-18 DIAGNOSIS — D582 Other hemoglobinopathies: Secondary | ICD-10-CM | POA: Diagnosis not present

## 2015-10-18 DIAGNOSIS — J069 Acute upper respiratory infection, unspecified: Secondary | ICD-10-CM

## 2015-10-18 MED ORDER — HYDROCOD POLST-CPM POLST ER 10-8 MG/5ML PO SUER: 5.0000 mL | Freq: Two times a day (BID) | ORAL | Status: DC | PRN

## 2015-10-18 MED ORDER — BENZONATATE 100 MG PO CAPS: 100.0000 mg | ORAL_CAPSULE | Freq: Three times a day (TID) | ORAL | Status: DC | PRN

## 2015-10-18 NOTE — Telephone Encounter (Signed)
Pt called remembered Tussolon Pearls have helped his cough in the past wants to know if these can be called in to his pharmacy. Pharm is [facility] in [place]. Thanks.

## 2015-10-18 NOTE — Telephone Encounter (Signed)
Routing to provider  

## 2015-10-18 NOTE — Telephone Encounter (Signed)
Yes, Rx sent 

## 2015-10-18 NOTE — Progress Notes (Signed)
BP 136/86 mmHg  Pulse 89  Temp(Src) 98.5 F (36.9 C)  Ht 5' 11.5" (1.816 m)  Wt 226 lb (102.513 kg)  BMI 31.08 kg/m2  SpO2 97%   Subjective:    Patient ID: Tom Wilkerson, male    DOB: July 29, 1966, 49 y.o.   MRN: [id number]  HPI: Tom Wilkerson is a 49 y.o. male  Chief Complaint  Patient presents with  . URI    started a week ago sunday. Chest congestion, coughing, productive in the morning, no fever, sore throat.   Sick for a week Chest congestion, bringing up greenish globs of stuff; he can't tell if from chest or from the back of the sinuses; blowing out the same stuff In the back of the throat Already coughing and coughing and has his gag reflex going He has been using regular mucinex, then switched to mucinex DM; also using claritin and it helps dry stuff out; using more claritin, every 16 hours instead of 24 hours; ran out of dymista Travel to [place] No weird rash No swollen glands No facial pain; does get pain in the side of the temple on the right, icepick No wheezing, but wife says making noises when he sleeps; trouble sleeping Ears are okay He thought he was getting better but had the appt and kept it Hx of C diff  Relevant past medical, surgical history reviewed and updated as indicated. Interim medical history since our last visit reviewed. He had elevated hemoglobin, followed by urologist; testosterone-related he thinks; urologist is having him donate blood; will see him again soon to recheck levels  Allergies and medications reviewed and updated.  Review of Systems Per HPI unless specifically indicated above     Objective:    BP 136/86 mmHg  Pulse 89  Temp(Src) 98.5 F (36.9 C)  Ht 5' 11.5" (1.816 m)  Wt 226 lb (102.513 kg)  BMI 31.08 kg/m2  SpO2 97%  Wt Readings from Last 3 Encounters:  10/18/15 226 lb (102.513 kg)  09/07/13 218 lb (98.884 kg)  10/24/12 221 lb (100.245 kg)    Physical Exam  Constitutional: He appears well-developed and  well-nourished. No distress.  HENT:  Head: Normocephalic and atraumatic.  Right Ear: Hearing, tympanic membrane, external ear and ear canal normal. Tympanic membrane is not erythematous. No middle ear effusion.  Left Ear: Hearing, tympanic membrane, external ear and ear canal normal. Tympanic membrane is not erythematous.  No middle ear effusion.  Nose: Rhinorrhea (scant cloudy) present. No mucosal edema.  Mouth/Throat: Oropharynx is clear and moist and mucous membranes are normal.  Eyes: EOM are normal. Right eye exhibits no discharge. Left eye exhibits no discharge. No scleral icterus.  Cardiovascular: Normal rate and regular rhythm.   Pulmonary/Chest: Effort normal and breath sounds normal. No accessory muscle usage. No respiratory distress. He has no decreased breath sounds. He has no wheezes. He has no rhonchi. He has no rales.  Lymphadenopathy:    He has no cervical adenopathy.  Skin: He is not diaphoretic.  Psychiatric: He has a normal mood and affect.       Assessment & Plan:   Problem List Items Addressed This Visit      Other   Elevated hemoglobin ([place])    Monitored by urologist; discussed with patient increased risk of stroke; keep close f/u per urologist's directions, f/u blood counts; start aspirin 81 mg daily, reasons to stop reviewed; urologist is decreasing testosterone in response to counts       Other  Visit Diagnoses    Upper respiratory infection    -  Primary    likely viral; antibiotics not indicated and not recommended at this time; watch for s/s of secondary infection; see AVS; cough syrup prescribed with warnings        Follow up plan: No Follow-up on file.  Meds ordered this encounter  Medications  . lisinopril (PRINIVIL,ZESTRIL) 10 MG tablet    Sig: Take 10 mg by mouth daily.  Marland Kitchen buPROPion (WELLBUTRIN) 75 MG tablet    Sig: Take 75 mg by mouth daily.  . Probiotic Product (PROBIOTIC PO)    Sig: Take by mouth daily.  Marland Kitchen omeprazole (PRILOSEC) 40 MG  capsule    Sig: Take 40 mg by mouth daily.  . sildenafil (REVATIO) 20 MG tablet    Sig: Take 20 mg by mouth.  . chlorpheniramine-HYDROcodone (TUSSIONEX PENNKINETIC ER) 10-8 MG/5ML SUER    Sig: Take 5 mLs by mouth every 12 (twelve) hours as needed for cough.    Dispense:  115 mL    Refill:  0   An after-visit summary was printed and given to the patient at [place].  Please see the patient instructions which may contain other information and recommendations beyond what is mentioned above in the assessment and plan.

## 2015-10-18 NOTE — Patient Instructions (Addendum)
Try vitamin C (orange juice if not diabetic or vitamin C tablets) and drink green tea to help your immune system during your illness Get plenty of rest and hydration Use the cough syrup as directed if needed; never mix with alcohol or pain pills or sleeping pills You can try echinacea to help boost your immune system Watch for secondary infections and seek medical care right away if you get worse I'll recommend getting back on 81 mg of coated daily aspirin

## 2015-10-18 NOTE — Assessment & Plan Note (Signed)
Monitored by urologist; discussed with patient increased risk of stroke; keep close f/u per urologist's directions, f/u blood counts; start aspirin 81 mg daily, reasons to stop reviewed; urologist is decreasing testosterone in response to counts

## 2016-01-02 ENCOUNTER — Other Ambulatory Visit: Payer: Self-pay | Admitting: Family Medicine

## 2016-01-02 NOTE — Telephone Encounter (Signed)
Last seen Oct 18, 2015

## 2016-06-17 ENCOUNTER — Ambulatory Visit (INDEPENDENT_AMBULATORY_CARE_PROVIDER_SITE_OTHER): Payer: 59 | Admitting: Family Medicine

## 2016-06-17 ENCOUNTER — Encounter: Payer: Self-pay | Admitting: Family Medicine

## 2016-06-17 VITALS — BP 132/80 | HR 71 | Temp 98.4°F | Resp 14 | Wt 230.0 lb

## 2016-06-17 DIAGNOSIS — G4700 Insomnia, unspecified: Secondary | ICD-10-CM | POA: Diagnosis not present

## 2016-06-17 DIAGNOSIS — N400 Enlarged prostate without lower urinary tract symptoms: Secondary | ICD-10-CM | POA: Diagnosis not present

## 2016-06-17 DIAGNOSIS — Z5181 Encounter for therapeutic drug level monitoring: Secondary | ICD-10-CM

## 2016-06-17 DIAGNOSIS — F419 Anxiety disorder, unspecified: Secondary | ICD-10-CM

## 2016-06-17 DIAGNOSIS — E291 Testicular hypofunction: Secondary | ICD-10-CM | POA: Diagnosis not present

## 2016-06-17 DIAGNOSIS — N182 Chronic kidney disease, stage 2 (mild): Secondary | ICD-10-CM

## 2016-06-17 DIAGNOSIS — R7301 Impaired fasting glucose: Secondary | ICD-10-CM

## 2016-06-17 DIAGNOSIS — E785 Hyperlipidemia, unspecified: Secondary | ICD-10-CM | POA: Diagnosis not present

## 2016-06-17 DIAGNOSIS — R635 Abnormal weight gain: Secondary | ICD-10-CM

## 2016-06-17 MED ORDER — CLONAZEPAM 0.5 MG PO TABS: 0.5000 mg | ORAL_TABLET | Freq: Two times a day (BID) | ORAL | 1 refills | Status: DC | PRN

## 2016-06-17 MED ORDER — CYCLOBENZAPRINE HCL 5 MG PO TABS: 5.0000 mg | ORAL_TABLET | Freq: Three times a day (TID) | ORAL | 1 refills | Status: DC | PRN

## 2016-06-17 NOTE — Progress Notes (Signed)
BP 132/80   Pulse 71   Temp 98.4 F (36.9 C) (Oral)   Resp 14   Wt 230 lb (104.3 kg)   SpO2 98%   BMI 31.63 kg/m    Subjective:    Patient ID: Tom Wilkerson, male    DOB: [birth year], 50 y.o.   MRN: [id number]  HPI: Tom Wilkerson is a 50 y.o. male  Chief Complaint  Patient presents with  . Medication Refill   He has high blood pressure; good control today  He will spend 4 hours sometimes and can't turn his thoughts off, ruminating; one thought after another; thinks about the [place] and then at work, then about the dog; comes and goes, then he gets tired and then sleeps; clonazepam would help him, puts brain in neutral; used 30 pills the whole year; he finally decaffeinated through the last month; was drinking diet Coke at 9 pm; was drinking "a bucket"; drinking regular Sprite, put on a little weight, but stopped the tea and diet Coke; coffee 7-9 am; no weird thoughts  High cholesterol; taking statin; got up last night and had glass of milk; he'll return for labs  Testosterone supplementation; he wants to do a blood donation before he does the blood work; he'll quarterly blood donation, Hgb 18.1; Dr. 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 Allergic rhinitis, cause unspecified   . Chest pain, unspecified   . Chicken pox   . Chronic prostatitis   . Elevated blood pressure reading without diagnosis of hypertension   . Esophageal reflux   . Insomnia, unspecified   . Irritability   . Nonspecific abnormal electrocardiogram (ECG) (EKG)   . Other alopecia   . Other  and unspecified hyperlipidemia   . Other testicular hypofunction   . Sleep related leg cramps   . Unspecified contusion of eye   . Unspecified hearing loss   . Unspecified hyperplasia of prostate without urinary obstruction and other lower urinary tract symptoms (LUTS)    Past Surgical History:  Procedure Laterality Date  . DENTAL RESTORATION/EXTRACTION WITH X-RAY    . TONSILLECTOMY  01/31/2009   [name]   Family History  Problem Relation Age of Onset  . Cancer      malignancy prostate  . Hypertension Mother   . Lung cancer Mother   . Depression Mother   . Hyperlipidemia Mother   . Arthritis Mother   . Kidney cancer Mother   . Cancer Mother     kidney, lung  . Migraines Sister   . CAD Father     CABG age 14  . Hypertension Father   . Stroke Father   . Hyperlipidemia Father   . Prostate cancer Father   . Congestive Heart Failure Father   . Cancer Father     prostate  . Liver disease Brother     on liver transplant list  . Ulcerative colitis Brother   . COPD Neg Hx   . Diabetes Neg Hx   . Heart disease Neg Hx    Social  History  Substance Use Topics  . Smoking status: Never Smoker  . Smokeless tobacco: Never Used  . Alcohol use Yes     Comment: occasional socially twice weekly beer    Interim medical history since last visit reviewed. Allergies and medications reviewed  Review of Systems Per HPI unless specifically indicated above     Objective:    BP 132/80   Pulse 71   Temp 98.4 F (36.9 C) (Oral)   Resp 14   Wt 230 lb (104.3 kg)   SpO2 98%   BMI 31.63 kg/m   Wt Readings from Last 3 Encounters:  06/17/16 230 lb (104.3 kg)  10/18/15 226 lb (102.5 kg)  09/07/13 218 lb (98.9 kg)    Physical Exam  Constitutional: He appears well-developed and well-nourished. No distress.  HENT:  Head: Normocephalic and atraumatic.  Eyes: EOM are normal. No scleral icterus.  Neck: No thyromegaly present.  Cardiovascular: Normal rate and regular rhythm.     Pulmonary/Chest: Effort normal and breath sounds normal.  Abdominal: Soft. Bowel sounds are normal. He exhibits no distension.  Musculoskeletal: He exhibits no edema.  Neurological: He is alert.  Skin: Skin is warm and dry. No pallor.  Psychiatric: He has a normal mood and affect. His behavior is normal. Judgment and thought content normal.   Results for orders placed or performed in visit on 06/12/15  Comprehensive metabolic panel  Result Value Ref Range   Glucose 109 (H) 65 - 99 mg/dL   BUN 12 6 - 24 mg/dL   Creatinine, Ser 1.40 (H) 0.76 - 1.27 mg/dL   GFR calc non Af Amer 58 (L) >59 mL/min/1.73   GFR calc Af Amer 67 >59 mL/min/1.73   BUN/Creatinine Ratio 9 9 - 20   Sodium 141 134 - 144 mmol/L   Potassium 3.9 3.5 - 5.2 mmol/L   Chloride 96 (L) 97 - 108 mmol/L   CO2 25 18 - 29 mmol/L   Calcium 9.4 8.7 - 10.2 mg/dL   Total Protein 6.5 6.0 - 8.5 g/dL   Albumin 4.4 3.5 - 5.5 g/dL   Globulin, Total 2.1 1.5 - 4.5 g/dL   Albumin/Globulin Ratio 2.1 1.1 - 2.5   Bilirubin Total 0.7 0.0 - 1.2 mg/dL   Alkaline Phosphatase 88 39 - 117 IU/L   AST 12 0 - 40 IU/L   ALT 16 0 - 44 IU/L  NMR, lipoprofile  Result Value Ref Range   LDL Particle Number 970 <1,000 nmol/L   LDL-C 85 0 - 99 mg/dL   HDL Cholesterol by NMR 36 (L) >39 mg/dL   Triglycerides by NMR 113 0 - 149 mg/dL   Cholesterol 144 100 - 199 mg/dL   HDL Particle Number 28.1 (L) >=30.5 umol/L   Small LDL Particle Number 167 <=527 nmol/L   LDL Size 21.0 >20.5 nm   LP-IR Score 49 (H) <=45      Assessment & Plan:   Problem List Items Addressed This Visit      Endocrine   Impaired fasting glucose    Check glucose (fasting) and A1c; limit regular soft drinks      Relevant Orders   Hemoglobin A1c   Hypogonadism male    Managed by urologist        Genitourinary   CKD (chronic kidney disease) stage 2, GFR 60-89 ml/min    Sees nephrologist; avoid NSAIDs; check creatinine      Relevant Orders   COMPLETE METABOLIC PANEL  WITH GFR   BPH (  benign prostatic hyperplasia)    Managed by urologist        Other   Weight gain   Relevant Orders   TSH   Medication monitoring encounter    Check CBC, sgpt, Cr, K+      Relevant Orders   CBC with Differential/Platelet   COMPLETE METABOLIC PANEL WITH GFR   Insomnia    Try to process thoughts earlier in the day; try meditation; Rx if needed; never mix with alcohol or pain pills      Relevant Medications   clonazePAM (KLONOPIN) 0.5 MG tablet   Dyslipidemia - Primary    Limit saturated fats, check lipids, continue statins      Relevant Orders   Lipid panel   Anxiety    Continue wellbutrin, PRN benzo       Other Visit Diagnoses   None.     Follow up plan: Return in about 6 months (around 12/18/2016) for follow-up.  An after-visit summary was printed and given to the patient at [place].  Please see the patient instructions which may contain other information and recommendations beyond what is mentioned above in the assessment and plan.  Meds ordered this encounter  Medications  . nystatin (MYCOSTATIN/NYSTOP) powder    Sig: Apply topically.  . clonazePAM (KLONOPIN) 0.5 MG tablet    Sig: Take 1 tablet (0.5 mg total) by mouth 2 (two) times daily as needed for anxiety.    Dispense:  20 tablet    Refill:  1  . cyclobenzaprine (FLEXERIL) 5 MG tablet    Sig: Take 1 tablet (5 mg total) by mouth every 8 (eight) hours as needed for muscle spasms.    Dispense:  30 tablet    Refill:  1   Orders Placed This Encounter  Procedures  . CBC with Differential/Platelet  . COMPLETE METABOLIC PANEL WITH GFR  . Lipid panel  . Hemoglobin A1c  . TSH

## 2016-06-17 NOTE — Assessment & Plan Note (Signed)
Sees nephrologist; avoid NSAIDs; check creatinine

## 2016-06-17 NOTE — Assessment & Plan Note (Addendum)
Check glucose (fasting) and A1c; limit regular soft drinks

## 2016-06-17 NOTE — Assessment & Plan Note (Signed)
Managed by urologist 

## 2016-06-17 NOTE — Assessment & Plan Note (Signed)
Check CBC, sgpt, Cr, K+

## 2016-06-17 NOTE — Assessment & Plan Note (Signed)
Try to process thoughts earlier in the day; try meditation; Rx if needed; never mix with alcohol or pain pills

## 2016-06-17 NOTE — Patient Instructions (Addendum)
12 Ways to Curb Anxiety  ?Anxiety is normal human sensation. It is what helped our ancestors survive the pitfalls of the wilderness. Anxiety is defined as experiencing worry or nervousness about an imminent event or something with an uncertain outcome. It is a feeling experienced by most people at some point in their lives. Anxiety can be triggered by a very personal issue, such as the illness of a loved one, or an event of global proportions, such as a refugee crisis. Some of the symptoms of anxiety are:  Feeling restless.  Having a feeling of impending danger.  Increased heart rate.  Rapid breathing. Sweating.  Shaking.  Weakness or feeling tired.  Difficulty concentrating on anything except the current worry.  Insomnia.  Stomach or bowel problems. What can we do about anxiety we may be feeling? There are many techniques to help manage stress and relax. Here are 12 ways you can reduce your anxiety almost immediately: 1. Turn off the constant feed of information. Take a social media sabbatical. Studies have shown that social media directly contributes to social anxiety.  2. Monitor your television viewing habits. Are you watching shows that are also contributing to your anxiety, such as 24-hour news stations? Try watching something else, or better yet, nothing at all. Instead, listen to music, read an inspirational book or practice a hobby. 3. Eat nutritious meals. Also, don't skip meals and keep healthful snacks on hand. Hunger and poor diet contributes to feeling anxious. 4. Sleep. Sleeping on a regular schedule for at least seven to eight hours a night will do wonders for your outlook when you are awake. 5. Exercise. Regular exercise will help rid your body of that anxious energy and help you get more restful sleep. 6. Try deep (diaphragmatic) breathing. Inhale slowly through your nose for five seconds and exhale through your mouth. 7. Practice acceptance and gratitude. When anxiety hits,  accept that there are things out of your control that shouldn't be of immediate concern.  8. Seek out humor. When anxiety strikes, watch a funny video, read jokes or call a friend who makes you laugh. Laughter is healing for our bodies and releases endorphins that are calming. 9. Stay positive. Take the effort to replace negative thoughts with positive ones. Try to see a stressful situation in a positive light. Try to come up with solutions rather than dwelling on the problem. 10. Figure out what triggers your anxiety. Keep a journal and make note of anxious moments and the events surrounding them. This will help you identify triggers you can avoid or even eliminate. 11. Talk to someone. Let a trusted friend, family member or even trained professional know that you are feeling overwhelmed and anxious. Verbalize what you are feeling and why.  12. Volunteer. If your anxiety is triggered by a crisis on a large scale, become an advocate and work to resolve the problem that is causing you unease. Anxiety is often unwelcome and can become overwhelming. If not kept in check, it can become a disorder that could require medical treatment. However, if you take the time to care for yourself and avoid the triggers that make you anxious, you will be able to find moments of relaxation and clarity that make your life much more enjoyable.  Return for fasting labs in the next week or two  Try to limit saturated fats in your diet (bologna, hot dogs, barbeque, cheeseburgers, hamburgers, steak, bacon, sausage, cheese, etc.) and get more fresh fruits, vegetables, and whole grains  Your goal blood pressure is less than 140 mmHg on top. Try to follow the DASH guidelines (DASH stands for Dietary Approaches to Stop Hypertension) Try to limit the sodium in your diet.  Ideally, consume less than 1.5 grams (less than 1,500mg ) p

## 2016-06-17 NOTE — Assessment & Plan Note (Signed)
Limit saturated fats, check lipids, continue statins

## 2016-06-17 NOTE — Assessment & Plan Note (Signed)
Continue wellbutrin, PRN benzo

## 2016-06-27 ENCOUNTER — Other Ambulatory Visit: Payer: Self-pay | Admitting: Family Medicine

## 2016-06-27 DIAGNOSIS — E785 Hyperlipidemia, unspecified: Secondary | ICD-10-CM

## 2016-06-27 NOTE — Telephone Encounter (Signed)
Labs still pending; rx for one year of CCB; 3 months of statin; will send more statin when labs are back; note to pharmacy

## 2016-06-28 ENCOUNTER — Other Ambulatory Visit: Payer: Self-pay | Admitting: Family Medicine

## 2016-06-28 NOTE — Telephone Encounter (Signed)
rx sent

## 2016-08-05 ENCOUNTER — Encounter: Payer: Self-pay | Admitting: Family Medicine

## 2016-08-05 ENCOUNTER — Ambulatory Visit (INDEPENDENT_AMBULATORY_CARE_PROVIDER_SITE_OTHER): Payer: 59 | Admitting: Family Medicine

## 2016-08-05 VITALS — BP 122/80 | HR 75 | Temp 98.1°F | Resp 14 | Wt 224.5 lb

## 2016-08-05 DIAGNOSIS — E785 Hyperlipidemia, unspecified: Secondary | ICD-10-CM | POA: Diagnosis not present

## 2016-08-05 DIAGNOSIS — Z5181 Encounter for therapeutic drug level monitoring: Secondary | ICD-10-CM | POA: Diagnosis not present

## 2016-08-05 DIAGNOSIS — F419 Anxiety disorder, unspecified: Secondary | ICD-10-CM | POA: Diagnosis not present

## 2016-08-05 DIAGNOSIS — D582 Other hemoglobinopathies: Secondary | ICD-10-CM | POA: Diagnosis not present

## 2016-08-05 DIAGNOSIS — Z23 Encounter for immunization: Secondary | ICD-10-CM | POA: Diagnosis not present

## 2016-08-05 DIAGNOSIS — R030 Elevated blood-pressure reading, without diagnosis of hypertension: Secondary | ICD-10-CM

## 2016-08-05 DIAGNOSIS — IMO0001 Reserved for inherently not codable concepts without codable children: Secondary | ICD-10-CM

## 2016-08-05 DIAGNOSIS — N182 Chronic kidney disease, stage 2 (mild): Secondary | ICD-10-CM | POA: Diagnosis not present

## 2016-08-05 DIAGNOSIS — R635 Abnormal weight gain: Secondary | ICD-10-CM | POA: Diagnosis not present

## 2016-08-05 DIAGNOSIS — R7301 Impaired fasting glucose: Secondary | ICD-10-CM | POA: Diagnosis not present

## 2016-08-05 DIAGNOSIS — H8110 Benign paroxysmal vertigo, unspecified ear: Secondary | ICD-10-CM

## 2016-08-05 LAB — CBC WITH DIFFERENTIAL/PLATELET
BASOS ABS: 58 {cells}/uL (ref 0–200)
Basophils Relative: 1 %
EOS ABS: 174 {cells}/uL (ref 15–500)
EOS PCT: 3 %
HEMATOCRIT: 47.7 % (ref 38.5–50.0)
HEMOGLOBIN: 15.7 g/dL (ref 13.2–17.1)
LYMPHS ABS: 1160 {cells}/uL (ref 850–3900)
Lymphocytes Relative: 20 %
MCH: 28.1 pg (ref 27.0–33.0)
MCHC: 32.9 g/dL (ref 32.0–36.0)
MCV: 85.3 fL (ref 80.0–100.0)
MONO ABS: 638 {cells}/uL (ref 200–950)
MPV: 11 fL (ref 7.5–12.5)
Monocytes Relative: 11 %
NEUTROS ABS: 3770 {cells}/uL (ref 1500–7800)
Neutrophils Relative %: 65 %
Platelets: 258 10*3/uL (ref 140–400)
RBC: 5.59 MIL/uL (ref 4.20–5.80)
RDW: 14.4 % (ref 11.0–15.0)
WBC: 5.8 10*3/uL (ref 3.8–10.8)

## 2016-08-05 LAB — TSH: TSH: 2 mIU/L (ref 0.40–4.50)

## 2016-08-05 NOTE — Progress Notes (Signed)
BP 122/80   Pulse 75   Temp 98.1 F (36.7 C) (Oral)   Resp 14   Wt 224 lb 8 oz (101.8 kg)   SpO2 98%   BMI 30.88 kg/m    Subjective:    Patient ID: [name], male    DOB: Aug 17, 1966, 50 y.o.   MRN: [id number]  HPI: ANTRONE ZEH is a 50 y.o. male  Chief Complaint  Patient presents with  . Dizziness   Patient is here for dizziness It started noticebaly about 3 weeks ago; he was doing something really strenuous, bending over in a hole; 100 degrees, working harder than he normally does; everything moved to the right, not all the way around, just the beginning sensation; thought he'd better cool off and slow down and that took care of it; happened a few times since then with standing up; thought maybe BP-related; then just sitting there eating at a restaurant and looked up from reading the newspaper, and it happened; left the restaurant to get home, then subsided for the most part; since then, it's hit or miss He has tried to see if related to time of day Happens if he exerts himself really hard and then exerting and bending forward, then raising back up; bending over and pulling weeds, then standing back up Trying to stay hydrated No ear pain, no fever; definitely some sinus drainage; he does not usually get headaches; he gets pain across the back of the head, temples, last few days; no rash, no travel [place]; wondering if something sinus-wise Did a double red blood cell donation, and did not feel good for a week after that; this donation was a month ago Took some benzo b/c of recent stress with work Came fasting for labs Has been checking his BP; few in the 110's, but most 120-130 range  Depression screen [facility] 2/9 06/17/2016  Decreased Interest 0  Down, Depressed, Hopeless 0  PHQ - 2 Score 0   Relevant past medical, surgical, family and social history reviewed Past Medical History:  Diagnosis Date  . Allergic rhinitis, cause unspecified   .  Chest pain, unspecified   . Chicken pox   . Chronic prostatitis   . Elevated blood pressure reading without diagnosis of hypertension   . Esophageal reflux   . Insomnia, unspecified   . Irritability   . Nonspecific abnormal electrocardiogram (ECG) (EKG)   . Other alopecia   . Other and unspecified hyperlipidemia   . Other testicular hypofunction   . Sleep related leg cramps   . Unspecified contusion of eye   . Unspecified hearing loss   . Unspecified hyperplasia of prostate without urinary obstruction and other lower urinary tract symptoms (LUTS)    Past Surgical History:  Procedure Laterality Date  . DENTAL RESTORATION/EXTRACTION WITH X-RAY    . TONSILLECTOMY  01/31/2009   [name]   Family History  Problem Relation Age of Onset  . Cancer      malignancy prostate  . Hypertension Mother   . Lung cancer Mother   . Depression Mother   . Hyperlipidemia Mother   . Arthritis Mother   . Kidney cancer Mother   . Cancer Mother     kidney, lung  . Migraines Sister   . CAD Father     CABG age 61  . Hypertension Father   . Stroke Father   . Hyperlipidemia Father   . Prostate cancer Father   .  Congestive Heart Failure Father   . Cancer Father     prostate  . Liver disease Brother     on liver transplant list  . Ulcerative colitis Brother   . COPD Neg Hx   . Diabetes Neg Hx   . Heart disease Neg Hx    Social History  Substance Use Topics  . Smoking status: Never Smoker  . Smokeless tobacco: Never Used  . Alcohol use Yes     Comment: occasional socially twice weekly beer   Interim medical history since last visit reviewed. Allergies and medications reviewed  Review of Systems Per HPI unless specifically indicated above     Objective:    BP 122/80   Pulse 75   Temp 98.1 F (36.7 C) (Oral)   Resp 14   Wt 224 lb 8 oz (101.8 kg)   SpO2 98%   BMI 30.88 kg/m   Wt Readings from Last 3 Encounters:  08/05/16 224 lb 8 oz (101.8 kg)  06/17/16 230 lb (104.3 kg)    10/18/15 226 lb (102.5 kg)    Physical Exam  Constitutional: He appears well-developed and well-nourished. No distress.  HENT:  Head: Normocephalic and atraumatic.  Right Ear: Hearing, tympanic membrane, external ear and ear canal normal. No drainage. Tympanic membrane is not erythematous. No middle ear effusion.  Left Ear: Hearing, tympanic membrane, external ear and ear canal normal. No drainage. Tympanic membrane is not erythematous.  No middle ear effusion.  Nose: No rhinorrhea.  Mouth/Throat: Oropharynx is clear and moist and mucous membranes are normal.  Eyes: EOM are normal. No scleral icterus.  Neck: No thyromegaly present.  Cardiovascular: Normal rate and regular rhythm.   Pulmonary/Chest: Effort normal and breath sounds normal.  Abdominal: Soft. Bowel sounds are normal. He exhibits no distension.  Musculoskeletal: He exhibits no edema.  Neurological: He is alert. He displays no tremor. Coordination and gait normal.  No tics, no dysdiadochokinesis, normal finger-to-nose and finger-to-nose-to-finger testing  Skin: Skin is warm and dry. No pallor.  Psychiatric: He has a normal mood and affect. His behavior is normal. Judgment and thought content normal.   Results for orders placed or performed in visit on 06/12/15  Comprehensive metabolic panel  Result Value Ref Range   Glucose 109 (H) 65 - 99 mg/dL   BUN 12 6 - 24 mg/dL   Creatinine, Ser 1.40 (H) 0.76 - 1.27 mg/dL   GFR calc non Af Amer 58 (L) >59 mL/min/1.73   GFR calc Af Amer 67 >59 mL/min/1.73   BUN/Creatinine Ratio 9 9 - 20   Sodium 141 134 - 144 mmol/L   Potassium 3.9 3.5 - 5.2 mmol/L   Chloride 96 (L) 97 - 108 mmol/L   CO2 25 18 - 29 mmol/L   Calcium 9.4 8.7 - 10.2 mg/dL   Total Protein 6.5 6.0 - 8.5 g/dL   Albumin 4.4 3.5 - 5.5 g/dL   Globulin, Total 2.1 1.5 - 4.5 g/dL   Albumin/Globulin Ratio 2.1 1.1 - 2.5   Bilirubin Total 0.7 0.0 - 1.2 mg/dL   Alkaline Phosphatase 88 39 - 117 IU/L   AST 12 0 - 40 IU/L    ALT 16 0 - 44 IU/L  NMR, lipoprofile  Result Value Ref Range   LDL Particle Number 970 <1,000 nmol/L   LDL-C 85 0 - 99 mg/dL   HDL Cholesterol by NMR 36 (L) >39 mg/dL   Triglycerides by NMR 113 0 - 149 mg/dL   Cholesterol 144 100 -  199 mg/dL   HDL Particle Number 28.1 (L) >=30.5 umol/L   Small LDL Particle Number 167 <=527 nmol/L   LDL Size 21.0 >20.5 nm   LP-IR Score 49 (H) <=45      Assessment & Plan:   Problem List Items Addressed This Visit      Endocrine   Impaired fasting glucose    Previous orders released, check labs        Nervous and Auditory   BPPV (benign paroxysmal positional vertigo) - Primary    Explained dx, refer for otolith repositioning      Relevant Orders   Ambulatory referral to ENT     Genitourinary   CKD (chronic kidney disease) stage 2, GFR 60-89 ml/min    Previous orders released, check Cr, K+, GFR; hydration encouraged        Other   Weight gain    Old entry, checking TSH; patient has lost some of that weight since last visit      Medication monitoring encounter   Elevated hemoglobin ([place])    Monitored by urologist; s/p double RBC donation      Dyslipidemia    Check fasting labs      Blood pressure elevated    Patient monitoring; all under [id number] systolic      Anxiety    Very occasional benzo use       Other Visit Diagnoses    Needs flu shot       Relevant Orders   Flu Vaccine QUAD 36+ mos PF IM (Fluarix & Fluzone Quad PF) (Completed)      Follow up plan: Return if symptoms worsen or fail to improve.  An after-visit summary was printed and given to the patient at [facility].  Please see the patient instructions which may contain other information and recommendations beyond what is mentioned above in the assessment and plan.  Meds ordered this encounter  Medications  . aspirin EC 81 MG tablet    Sig: Take 81 mg by mouth daily.    Orders Placed This Encounter  Procedures  . Flu Vaccine QUAD 36+ mos PF IM (Fluarix &  Fluzone Quad PF)  . Ambulatory referral to ENT

## 2016-08-05 NOTE — Assessment & Plan Note (Signed)
Previous orders released, check Cr, K+, GFR; hydration encouraged

## 2016-08-05 NOTE — Assessment & Plan Note (Signed)
Patient monitoring; all under [id number] systolic

## 2016-08-05 NOTE — Assessment & Plan Note (Signed)
Previous orders released, check labs

## 2016-08-05 NOTE — Assessment & Plan Note (Signed)
Very occasional benzo use

## 2016-08-05 NOTE — Patient Instructions (Addendum)
Benign Positional Vertigo Vertigo is the feeling that you or your surroundings are moving when they are not. Benign positional vertigo is the most common form of vertigo. The cause of this condition is not serious (is benign). This condition is triggered by certain movements and positions (is positional). This condition can be dangerous if it occurs while you are doing something that could endanger you or others, such as driving.  CAUSES In many cases, the cause of this condition is not known. It may be caused by a disturbance in an area of the inner ear that helps your brain to sense movement and balance. This disturbance can be caused by a viral infection (labyrinthitis), head injury, or repetitive motion. RISK FACTORS This condition is more likely to develop in:  Women.  People who are 50 years of age or older. SYMPTOMS Symptoms of this condition usually happen when you move your head or your eyes in different directions. Symptoms may start suddenly, and they usually last for less than a minute. Symptoms may include:  Loss of balance and falling.  Feeling like you are spinning or moving.  Feeling like your surroundings are spinning or moving.  Nausea and vomiting.  Blurred vision.  Dizziness.  Involuntary eye movement (nystagmus). Symptoms can be mild and cause only slight annoyance, or they can be severe and interfere with daily life. Episodes of benign positional vertigo may return (recur) over time, and they may be triggered by certain movements. Symptoms may improve over time. DIAGNOSIS This condition is usually diagnosed by medical history and a physical exam of the head, neck, and ears. You may be referred to a health care provider who specializes in ear, nose, and throat (ENT) problems (otolaryngologist) or a provider who specializes in disorders of the nervous system (neurologist). You may have additional testing, including:  MRI.  A CT scan.  Eye movement tests. Your  health care provider may ask you to change positions quickly while he or she watches you for symptoms of benign positional vertigo, such as nystagmus. Eye movement may be tested with an electronystagmogram (ENG), caloric stimulation, the Dix-Hallpike test, or the roll test.  An electroencephalogram (EEG). This records electrical activity in your brain.  Hearing tests. TREATMENT Usually, your health care provider will treat this by moving your head in specific positions to adjust your inner ear back to normal. Surgery may be needed in severe cases, but this is rare. In some cases, benign positional vertigo may resolve on its own in 2-4 weeks. HOME CARE INSTRUCTIONS Safety  Move slowly.Avoid sudden body or head movements.  Avoid driving.  Avoid operating heavy machinery.  Avoid doing any tasks that would be dangerous to you or others if a vertigo episode would occur.  If you have trouble walking or keeping your balance, try using a cane for stability. If you feel dizzy or unstable, sit down right away.  Return to your normal activities as told by your health care provider. Ask your health care provider what activities are safe for you. General Instructions  Take over-the-counter and prescription medicines only as told by your health care provider.  Avoid certain positions or movements as told by your health care provider.  Drink enough fluid to keep your urine clear or pale yellow.  Keep all follow-up visits as told by your health care provider. This is important. SEEK MEDICAL CARE IF:  You have a fever.  Your condition gets worse or you develop new symptoms.  Your family or friends   notice any behavioral changes.  Your nausea or vomiting gets worse.  You have numbness or a "pins and needles" sensation. SEEK IMMEDIATE MEDICAL CARE IF:  You have difficulty speaking or moving.  You are always dizzy.  You faint.  You develop severe headaches.  You have weakness in your  legs or arms.  You have changes in your hearing or vision.  You develop a stiff neck.  You develop sensitivity to light.   This information is not intended to replace advice given to you by your health care provider. Make sure you discuss any questions you have with your health care provider.   Document Released: 08/12/2006 Document Revised: 07/26/2015 Document Reviewed: 02/27/2015 Elsevier Interactive Patient Education 2016 [employer] Inc[employer]  

## 2016-08-05 NOTE — Assessment & Plan Note (Signed)
Monitored by urologist; s/p double RBC donation

## 2016-08-05 NOTE — Assessment & Plan Note (Signed)
Check fasting labs 

## 2016-08-05 NOTE — Assessment & Plan Note (Signed)
Old entry, checking TSH; patient has lost some of that weight since last visit

## 2016-08-05 NOTE — Assessment & Plan Note (Signed)
Explained dx, refer for otolith repositioning

## 2016-08-06 LAB — COMPLETE METABOLIC PANEL WITH GFR
ALBUMIN: 4.4 g/dL (ref 3.6–5.1)
ALK PHOS: 76 U/L (ref 40–115)
ALT: 10 U/L (ref 9–46)
AST: 12 U/L (ref 10–35)
BILIRUBIN TOTAL: 0.8 mg/dL (ref 0.2–1.2)
BUN: 15 mg/dL (ref 7–25)
CALCIUM: 9.5 mg/dL (ref 8.6–10.3)
CO2: 27 mmol/L (ref 20–31)
Chloride: 102 mmol/L (ref 98–110)
Creat: 1.2 mg/dL (ref 0.70–1.33)
GFR, EST NON AFRICAN AMERICAN: 70 mL/min (ref >=60)
GFR, Est African American: 80 mL/min (ref >=60)
Glucose, Bld: 107 mg/dL — ABNORMAL HIGH (ref 65–99)
POTASSIUM: 4.2 mmol/L (ref 3.5–5.3)
Sodium: 141 mmol/L (ref 135–146)
Total Protein: 6.7 g/dL (ref 6.1–8.1)

## 2016-08-06 LAB — LIPID PANEL
CHOL/HDL RATIO: 3.3 ratio (ref <=5.0)
CHOLESTEROL: 127 mg/dL (ref 125–200)
HDL: 39 mg/dL — AB (ref >=40)
LDL Cholesterol: 71 mg/dL (ref <130)
Triglycerides: 84 mg/dL (ref <150)
VLDL: 17 mg/dL (ref <30)

## 2016-08-06 LAB — HEMOGLOBIN A1C
Hgb A1c MFr Bld: 5.3 % (ref <5.7)
MEAN PLASMA GLUCOSE: 105 mg/dL

## 2016-08-26 ENCOUNTER — Encounter: Payer: Self-pay | Admitting: Family Medicine

## 2016-08-26 ENCOUNTER — Ambulatory Visit (INDEPENDENT_AMBULATORY_CARE_PROVIDER_SITE_OTHER): Payer: 59 | Admitting: Family Medicine

## 2016-08-26 VITALS — BP 118/74 | HR 70 | Temp 98.0°F | Resp 16 | Wt 227.3 lb

## 2016-08-26 DIAGNOSIS — N401 Enlarged prostate with lower urinary tract symptoms: Secondary | ICD-10-CM | POA: Diagnosis not present

## 2016-08-26 DIAGNOSIS — H5789 Other specified disorders of eye and adnexa: Secondary | ICD-10-CM | POA: Insufficient documentation

## 2016-08-26 DIAGNOSIS — R030 Elevated blood-pressure reading, without diagnosis of hypertension: Secondary | ICD-10-CM

## 2016-08-26 DIAGNOSIS — H578 Other specified disorders of eye and adnexa: Secondary | ICD-10-CM

## 2016-08-26 NOTE — Patient Instructions (Addendum)
STOP the amlodipine Monitor your blood pressure and keep me posted  Try the Relaxation Response, and use that technique  Steps to Elicit the Relaxation Response The following is the technique reprinted with permission from Dr. Billie Ruddy book The Relaxation Response pages 162-163 1. Sit quietly in a comfortable position. 2. Close your eyes. 3. Deeply relax all your muscles,  beginning at your feet and progressing up to your face.  Keep them relaxed. 4. Breathe through your nose.  Become aware of your breathing.  As you breathe out, say the word, "one"*,  silently to yourself. For example,  breathe in ... out, "one",- in .. out, "one", etc.  Breathe easily and naturally. 5. Continue for 10 to 20 minutes.  You may open your eyes to check the time, but do not use an alarm.  When you finish, sit quietly for several minutes,  at first with your eyes closed and later with your eyes opened.  Do not stand up for a few minutes. 6. Do not worry about whether you are successful  in achieving a deep level of relaxation.  Maintain a passive attitude and permit relaxation to occur at its own pace.  When distracting thoughts occur,  try to ignore them by not dwelling upon them  and return to repeating "one."  With practice, the response should come with little effort.  Practice the technique once or twice daily,  but not within two hours after any meal,  since the digestive processes seem to interfere with  the elicitation of the Relaxation Response. * It is better to use a soothing, mellifluous sound, preferably with no meaning. or association, to avoid stimulation of unnecessary thoughts - a mantra.  DASH Eating Plan DASH stands for "Dietary Approaches to Stop Hypertension." The DASH eating plan is a healthy eating plan that has been shown to reduce high blood pressure (hypertension). Additional health benefits may include reducing the risk of type 2 diabetes mellitus, heart disease,  and stroke. The DASH eating plan may also help with weight loss. WHAT DO I NEED TO KNOW ABOUT THE DASH EATING PLAN? For the DASH eating plan, you will follow these general guidelines:  Choose foods with a percent daily value for sodium of less than 5% (as listed on the food label).  Use salt-free seasonings or herbs instead of table salt or sea salt.  Check with your health care provider or pharmacist before using salt substitutes.  Eat lower-sodium products, often labeled as "lower sodium" or "no salt added."  Eat fresh foods.  Eat more vegetables, fruits, and low-fat dairy products.  Choose whole grains. Look for the word "whole" as the first word in the ingredient list.  Choose fish and skinless chicken or [place] more often than red meat. Limit fish, poultry, and meat to 6 oz (170 g) each day.  Limit sweets, desserts, sugars, and sugary drinks.  Choose heart-healthy fats.  Limit cheese to 1 oz (28 g) per day.  Eat more home-cooked food and less restaurant, buffet, and fast food.  Limit fried foods.  Cook foods using methods other than frying.  Limit canned vegetables. If you do use them, rinse them well to decrease the sodium.  When eating at a restaurant, ask that your food be prepared with less salt, or no salt if possible. WHAT FOODS CAN I EAT? Seek help from a dietitian for individual calorie needs. Grains Whole grain or whole wheat bread. Brown rice. Whole grain or whole wheat pasta. Bernardo Heater,  bulgur, and whole grain cereals. Low-sodium cereals. Corn or whole wheat flour tortillas. Whole grain cornbread. Whole grain crackers. Low-sodium crackers. Vegetables Fresh or frozen vegetables (raw, steamed, roasted, or grilled). Low-sodium or reduced-sodium tomato and vegetable juices. Low-sodium or reduced-sodium tomato sauce and paste. Low-sodium or reduced-sodium canned vegetables.  Fruits All fresh, canned (in natural juice), or frozen fruits. Meat and Other Protein  Products Ground beef (85% or leaner), grass-fed beef, or beef trimmed of fat. Skinless chicken or [place]. Ground chicken or [place]. Pork trimmed of fat. All fish and seafood. Eggs. Dried beans, peas, or lentils. Unsalted nuts and seeds. Unsalted canned beans. Dairy Low-fat dairy products, such as skim or 1% milk, 2% or reduced-fat cheeses, low-fat ricotta or cottage cheese, or plain low-fat yogurt. Low-sodium or reduced-sodium cheeses. Fats and Oils Tub margarines without trans fats. Light or reduced-fat mayonnaise and salad dressings (reduced sodium). Avocado. Safflower, olive, or canola oils. Natural peanut or almond butter. Other Unsalted popcorn and pretzels. The items listed above may not be a complete list of recommended foods or beverages. Contact your dietitian for more options. WHAT FOODS ARE NOT RECOMMENDED? Grains White bread. White pasta. White rice. Refined cornbread. Bagels and croissants. Crackers that contain trans fat. Vegetables Creamed or fried vegetables. Vegetables in a cheese sauce. Regular canned vegetables. Regular canned tomato sauce and paste. Regular tomato and vegetable juices. Fruits Dried fruits. Canned fruit in light or heavy syrup. Fruit juice. Meat and Other Protein Products Fatty cuts of meat. Ribs, chicken wings, bacon, sausage, bologna, salami, chitterlings, fatback, hot dogs, bratwurst, and packaged luncheon meats. Salted nuts and seeds. Canned beans with salt. Dairy Whole or 2% milk, cream, half-and-half, and cream cheese. Whole-fat or sweetened yogurt. Full-fat cheeses or blue cheese. Nondairy creamers and whipped toppings. Processed cheese, cheese spreads, or cheese curds. Condiments Onion and garlic salt, seasoned salt, table salt, and sea salt. Canned and packaged gravies. Worcestershire sauce. Tartar sauce. Barbecue sauce. Teriyaki sauce. Soy sauce, including reduced sodium. Steak sauce. Fish sauce. Oyster sauce. Cocktail sauce. Horseradish. Ketchup and  mustard. Meat flavorings and tenderizers. Bouillon cubes. Hot sauce. Tabasco sauce. Marinades. Taco seasonings. Relishes. Fats and Oils Butter, stick margarine, lard, shortening, ghee, and bacon fat. Coconut, palm kernel, or palm oils. Regular salad dressings. Other Pickles and olives. Salted popcorn and pretzels. The items listed above may not be a complete list of foods and beverages to avoid. Contact your dietitian for more information. WHERE CAN I FIND MORE INFORMATION? National Heart, Lung, and Blood Institute: travelstabloid.com   This information is not intended to replace advice given to you by your health care provider. Make sure you discuss any questions you have with your health care provider.   Document Released: 10/24/2011 Document Revised: 11/25/2014 Document Reviewed: 09/08/2013 Elsevier Interactive Patient Education [employer].

## 2016-08-26 NOTE — Progress Notes (Signed)
BP 118/74 (BP Location: Left Arm, Patient Position: Sitting, Cuff Size: Large)   Pulse 70   Temp 98 F (36.7 C) (Oral)   Resp 16   Wt 227 lb 5 oz (103.1 kg)   SpO2 96%   BMI 31.26 kg/m    Subjective:    Patient ID: Tom Wilkerson, male    DOB: July 21, 1966, 50 y.o.   MRN: [id number]  HPI: Tom Wilkerson is a 50 y.o. male  Chief Complaint  Patient presents with  . Medication Problem    Dizziness with medication; Stop flomax    Reviewed note from 08/05/16  He was seen by ENT and they did the vertigo test and he does not have vertigo  The urologist said that the two medicines together, flomax and CCB, could cause his sx About six months ago, he started taking the flomax every day He stopped taking the flomax and it fixed it for 2-3 days Now he just has this low grade headache, low grade jetlag She will lay down or sit down and lay back and close his eyes That was part of the dizziness He went online and it did say amlodipine could cause sx similar to those he is experiencing He took lisinopril before the cough He has not gone without his BP medicine and  130/80 at home with his machine He does not add salt to anything; does eat out a lot Two fried eggs and chicken breast Decaffeinated over last month and a half ago; he thought at first this was first from no caffeine; not completely caffeine free but not drinking five diet Cokes a day Drinks a caffeine-soda once in a while Had soup from [place] last night Did not sleep 3 hours last night Mind starts ruminating, laid down and woke back up and laid there for hours; little bit of [place] time [place] travel; stressed out about work; takes or the other every 3 months, does not crutch on medicine  He has noticed some issues with his eye; if he sweats really bad, his left eye feels like it has gasoline in it; sweated yesterday and eye feels like it's on fire; eye was bothering him; eye will pus and do all kinds of crazy stuff; tried  visine allergy; wonders if steroids would be helpful; only when sweating  Depression screen [facility] 2/9 08/26/2016 06/17/2016  Decreased Interest 0 0  Down, Depressed, Hopeless 0 0  PHQ - 2 Score 0 0   Relevant past medical, surgical, family and social history reviewed Past Medical History:  Diagnosis Date  . Allergic rhinitis, cause unspecified   . Chest pain, unspecified   . Chicken pox   . Chronic prostatitis   . Elevated blood pressure reading without diagnosis of hypertension   . Esophageal reflux   . Insomnia, unspecified   . Irritability   . Nonspecific abnormal electrocardiogram (ECG) (EKG)   . Other alopecia   . Other and unspecified hyperlipidemia   . Other testicular hypofunction   . Sleep related leg cramps   . Unspecified contusion of eye   . Unspecified hearing loss   . Unspecified hyperplasia of prostate without urinary obstruction and other lower urinary tract symptoms (LUTS)    Past Surgical History:  Procedure Laterality Date  . DENTAL RESTORATION/EXTRACTION WITH X-RAY    . TONSILLECTOMY  01/31/2009   [name]   Family History  Problem Relation Age of Onset  . Cancer      malignancy prostate  .  Hypertension Mother   . Lung cancer Mother   . Depression Mother   . Hyperlipidemia Mother   . Arthritis Mother   . Kidney cancer Mother   . Cancer Mother     kidney, lung  . Migraines Sister   . CAD Father     CABG age 82  . Hypertension Father   . Stroke Father   . Hyperlipidemia Father   . Prostate cancer Father   . Congestive Heart Failure Father   . Cancer Father     prostate  . Liver disease Brother     on liver transplant list  . Ulcerative colitis Brother   . COPD Neg Hx   . Diabetes Neg Hx   . Heart disease Neg Hx    Social History  Substance Use Topics  . Smoking status: Never Smoker  . Smokeless tobacco: Never Used  . Alcohol use Yes     Comment: occasional socially twice weekly beer   Interim medical history since last visit  reviewed. Allergies and medications reviewed  Review of Systems Per HPI unless specifically indicated above     Objective:    BP 118/74 (BP Location: Left Arm, Patient Position: Sitting, Cuff Size: Large)   Pulse 70   Temp 98 F (36.7 C) (Oral)   Resp 16   Wt 227 lb 5 oz (103.1 kg)   SpO2 96%   BMI 31.26 kg/m   Wt Readings from Last 3 Encounters:  08/26/16 227 lb 5 oz (103.1 kg)  08/05/16 224 lb 8 oz (101.8 kg)  06/17/16 230 lb (104.3 kg)    Physical Exam  Constitutional: He appears well-developed and well-nourished. No distress.  HENT:  Head: Normocephalic and atraumatic.  Eyes: EOM are normal. Right eye exhibits no discharge, no exudate and no hordeolum. No foreign body present in the right eye. Left eye exhibits no discharge, no exudate and no hordeolum. No foreign body present in the left eye. Right conjunctiva is not injected. Left conjunctiva is not injected.  Cardiovascular: Normal rate and regular rhythm.   Pulmonary/Chest: Effort normal and breath sounds normal.  Neurological: He is alert. He displays no tremor.  Psychiatric: He has a normal mood and affect.   Results for orders placed or performed in visit on 08/05/16  CBC with Differential/Platelet  Result Value Ref Range   WBC 5.8 3.8 - 10.8 K/uL   RBC 5.59 4.20 - 5.80 MIL/uL   Hemoglobin 15.7 13.2 - 17.1 g/dL   HCT 47.7 38.5 - 50.0 %   MCV 85.3 80.0 - 100.0 fL   MCH 28.1 27.0 - 33.0 pg   MCHC 32.9 32.0 - 36.0 g/dL   RDW 14.4 11.0 - 15.0 %   Platelets 258 140 - 400 K/uL   MPV 11.0 7.5 - 12.5 fL   Neutro Abs 3,770 1,500 - 7,800 cells/uL   Lymphs Abs 1,160 850 - 3,900 cells/uL   Monocytes Absolute 638 200 - 950 cells/uL   Eosinophils Absolute 174 15 - 500 cells/uL   Basophils Absolute 58 0 - 200 cells/uL   Neutrophils Relative % 65 %   Lymphocytes Relative 20 %   Monocytes Relative 11 %   Eosinophils Relative 3 %   Basophils Relative 1 %   Smear Review Criteria for review not met   COMPLETE METABOLIC  PANEL WITH GFR  Result Value Ref Range   Sodium 141 135 - 146 mmol/L   Potassium 4.2 3.5 - 5.3 mmol/L   Chloride 102 98 -  110 mmol/L   CO2 27 20 - 31 mmol/L   Glucose, Bld 107 (H) 65 - 99 mg/dL   BUN 15 7 - 25 mg/dL   Creat 1.20 0.70 - 1.33 mg/dL   Total Bilirubin 0.8 0.2 - 1.2 mg/dL   Alkaline Phosphatase 76 40 - 115 U/L   AST 12 10 - 35 U/L   ALT 10 9 - 46 U/L   Total Protein 6.7 6.1 - 8.1 g/dL   Albumin 4.4 3.6 - 5.1 g/dL   Calcium 9.5 8.6 - 10.3 mg/dL   GFR, Est African American 80 >=60 mL/min   GFR, Est Non African American 70 >=60 mL/min  Lipid panel  Result Value Ref Range   Cholesterol 127 125 - 200 mg/dL   Triglycerides 84 <150 mg/dL   HDL 39 (L) >=40 mg/dL   Total CHOL/HDL Ratio 3.3 <=5.0 Ratio   VLDL 17 <30 mg/dL   LDL Cholesterol 71 <130 mg/dL  Hemoglobin A1c  Result Value Ref Range   Hgb A1c MFr Bld 5.3 <5.7 %   Mean Plasma Glucose 105 mg/dL  TSH  Result Value Ref Range   TSH 2.00 0.40 - 4.50 mIU/L      Assessment & Plan:   Problem List Items Addressed This Visit      Cardiovascular and Mediastinum   Elevated blood pressure, situational - Primary    Patient will stay off of the CCB and monitor his BP; try DASH guidelines, weight management; call me with update        Genitourinary   BPH (benign prostatic hyperplasia)    He'll go back on flomax; stop the CCB; keep me posted        Other   Irritation of left eye    Refer to ophthalmologist; he was asking for ocular steroid; I explained that I was taught it is not my place ever to prescribe steroids for the eye as a family medicine doctor, and that should only done under the guidance and management of ophthalmology; he understands      Relevant Orders   Ambulatory referral to Ophthalmology    Other Visit Diagnoses   None.      Follow up plan: Return if symptoms worsen or fail to improve.  An after-visit summary was printed and given to the patient at [place].  Please see the patient  instructions which may contain other information and recommendations beyond what is mentioned above in the assessment and plan.  No orders of the defined types were placed in this encounter.   Orders Placed This Encounter  Procedures  . Ambulatory referral to Ophthalmology

## 2016-08-26 NOTE — Assessment & Plan Note (Addendum)
Refer to ophthalmologist; he was asking for ocular steroid; I explained that I was taught it is not my place ever to prescribe steroids for the eye as a family medicine doctor, and that should only done under the guidance and management of ophthalmology; he understands

## 2016-08-28 ENCOUNTER — Other Ambulatory Visit: Payer: Self-pay | Admitting: Family Medicine

## 2016-08-28 DIAGNOSIS — E785 Hyperlipidemia, unspecified: Secondary | ICD-10-CM

## 2016-08-29 NOTE — Telephone Encounter (Signed)
Sept 2017 labs reviewed; Rx approved 

## 2016-08-31 NOTE — Assessment & Plan Note (Signed)
He'll go back on flomax; stop the CCB; keep me posted

## 2016-08-31 NOTE — Assessment & Plan Note (Signed)
Patient will stay off of the CCB and monitor his BP; try DASH guidelines, weight management; call me with update

## 2016-12-09 DIAGNOSIS — K219 Gastro-esophageal reflux disease without esophagitis: Secondary | ICD-10-CM | POA: Diagnosis not present

## 2016-12-09 DIAGNOSIS — H903 Sensorineural hearing loss, bilateral: Secondary | ICD-10-CM | POA: Diagnosis not present

## 2016-12-20 ENCOUNTER — Ambulatory Visit: Payer: 59 | Admitting: Family Medicine

## 2016-12-27 ENCOUNTER — Ambulatory Visit (INDEPENDENT_AMBULATORY_CARE_PROVIDER_SITE_OTHER): Payer: 59 | Admitting: Family Medicine

## 2016-12-27 ENCOUNTER — Encounter: Payer: Self-pay | Admitting: Family Medicine

## 2016-12-27 DIAGNOSIS — E291 Testicular hypofunction: Secondary | ICD-10-CM | POA: Diagnosis not present

## 2016-12-27 DIAGNOSIS — N401 Enlarged prostate with lower urinary tract symptoms: Secondary | ICD-10-CM

## 2016-12-27 DIAGNOSIS — R030 Elevated blood-pressure reading, without diagnosis of hypertension: Secondary | ICD-10-CM | POA: Diagnosis not present

## 2016-12-27 DIAGNOSIS — E785 Hyperlipidemia, unspecified: Secondary | ICD-10-CM | POA: Diagnosis not present

## 2016-12-27 DIAGNOSIS — R7301 Impaired fasting glucose: Secondary | ICD-10-CM | POA: Diagnosis not present

## 2016-12-27 DIAGNOSIS — N182 Chronic kidney disease, stage 2 (mild): Secondary | ICD-10-CM

## 2016-12-27 DIAGNOSIS — D582 Other hemoglobinopathies: Secondary | ICD-10-CM

## 2016-12-27 NOTE — Progress Notes (Signed)
BP 118/70   Pulse 88   Temp 99 F (37.2 C) (Oral)   Resp 14   Wt 227 lb 1.6 oz (103 kg)   SpO2 95%   BMI 31.23 kg/m    Subjective:    Patient ID: Tom Wilkerson, male    DOB: Feb 18, 1966, 51 y.o.   MRN: [id number]  HPI: Tom Wilkerson is a 51 y.o. male  Chief Complaint  Patient presents with  . Follow-up   Here for follow-up The blood pressure medicine was causing the headaches; anything that drops his BP gives him headaches He had a 118/70 here today Not on BP medicine since October Saw Dr. 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 [name] for testosterone; giving blood regularly to keep H/H down  Previous prediabetes; last A1c 4 months ago was only 5.3  Renal insufficiency; last BUN 15, last Cr 1.2 on 08/05/2016  Depression screen [facility] 2/9 12/27/2016 08/26/2016 06/17/2016  Decreased Interest 0 0 0  Down, Depressed, Hopeless 0 0 0  PHQ - 2 Score 0 0 0   Relevant past medical, surgical, family and social history reviewed Past Medical History:  Diagnosis Date  . Allergic rhinitis, cause unspecified   . Chest pain, unspecified   . Chicken pox   . Chronic prostatitis   . Elevated blood pressure reading without diagnosis of hypertension   . Esophageal reflux   . Insomnia, unspecified   . Irritability   . Nonspecific abnormal electrocardiogram (ECG) (EKG)   . Other alopecia   . Other and unspecified hyperlipidemia   . Other testicular hypofunction   . Sleep related leg cramps   . Unspecified contusion of eye   . Unspecified  hearing loss   . Unspecified hyperplasia of prostate without urinary obstruction and other lower urinary tract symptoms (LUTS)    Past Surgical History:  Procedure Laterality Date  . DENTAL RESTORATION/EXTRACTION WITH X-RAY    . TONSILLECTOMY  01/31/2009   [name]   Family History  Problem Relation Age of Onset  . Cancer      malignancy prostate  . Hypertension Mother   . Lung cancer Mother   . Depression Mother   . Hyperlipidemia Mother   . Arthritis Mother   . Kidney cancer Mother   . Cancer Mother     kidney, lung  . Migraines Sister   . CAD Father     CABG age 68  . Hypertension Father   . Stroke Father   . Hyperlipidemia Father   . Prostate cancer Father   . Congestive Heart Failure Father   . Cancer Father     prostate  . Liver disease Brother     on liver transplant list  . Ulcerative colitis Brother   . COPD Neg Hx   . Diabetes Neg Hx   . Heart disease Neg Hx    Social History  Substance Use Topics  . Smoking status: Never Smoker  . Smokeless tobacco: Never Used  . Alcohol use  Yes     Comment: occasional socially twice weekly beer   Interim medical history since last visit reviewed. Allergies and medications reviewed  Review of Systems Per HPI unless specifically indicated above     Objective:    BP 118/70   Pulse 88   Temp 99 F (37.2 C) (Oral)   Resp 14   Wt 227 lb 1.6 oz (103 kg)   SpO2 95%   BMI 31.23 kg/m   Wt Readings from Last 3 Encounters:  12/27/16 227 lb 1.6 oz (103 kg)  08/26/16 227 lb 5 oz (103.1 kg)  08/05/16 224 lb 8 oz (101.8 kg)    Physical Exam  Constitutional: He appears well-developed and well-nourished. No distress.  HENT:  Head: Normocephalic and atraumatic.  Eyes: EOM are normal. Right eye exhibits no discharge, no exudate and no hordeolum. No foreign body present in the right eye. Left eye exhibits no discharge, no exudate and no hordeolum. No foreign body present in the left eye. Right conjunctiva is not injected.  Left conjunctiva is not injected.  Cardiovascular: Normal rate and regular rhythm.   Pulmonary/Chest: Effort normal and breath sounds normal.  Neurological: He is alert. He displays no tremor.  Psychiatric: He has a normal mood and affect.    Results for orders placed or performed in visit on 08/05/16  CBC with Differential/Platelet  Result Value Ref Range   WBC 5.8 3.8 - 10.8 K/uL   RBC 5.59 4.20 - 5.80 MIL/uL   Hemoglobin 15.7 13.2 - 17.1 g/dL   HCT 47.7 38.5 - 50.0 %   MCV 85.3 80.0 - 100.0 fL   MCH 28.1 27.0 - 33.0 pg   MCHC 32.9 32.0 - 36.0 g/dL   RDW 14.4 11.0 - 15.0 %   Platelets 258 140 - 400 K/uL   MPV 11.0 7.5 - 12.5 fL   Neutro Abs 3,770 1,500 - 7,800 cells/uL   Lymphs Abs 1,160 850 - 3,900 cells/uL   Monocytes Absolute 638 200 - 950 cells/uL   Eosinophils Absolute 174 15 - 500 cells/uL   Basophils Absolute 58 0 - 200 cells/uL   Neutrophils Relative % 65 %   Lymphocytes Relative 20 %   Monocytes Relative 11 %   Eosinophils Relative 3 %   Basophils Relative 1 %   Smear Review Criteria for review not met   COMPLETE METABOLIC PANEL WITH GFR  Result Value Ref Range   Sodium 141 135 - 146 mmol/L   Potassium 4.2 3.5 - 5.3 mmol/L   Chloride 102 98 - 110 mmol/L   CO2 27 20 - 31 mmol/L   Glucose, Bld 107 (H) 65 - 99 mg/dL   BUN 15 7 - 25 mg/dL   Creat 1.20 0.70 - 1.33 mg/dL   Total Bilirubin 0.8 0.2 - 1.2 mg/dL   Alkaline Phosphatase 76 40 - 115 U/L   AST 12 10 - 35 U/L   ALT 10 9 - 46 U/L   Total Protein 6.7 6.1 - 8.1 g/dL   Albumin 4.4 3.6 - 5.1 g/dL   Calcium 9.5 8.6 - 10.3 mg/dL   GFR, Est African American 80 >=60 mL/min   GFR, Est Non African American 70 >=60 mL/min  Lipid panel  Result Value Ref Range   Cholesterol 127 125 - 200 mg/dL   Triglycerides 84 <150 mg/dL   HDL 39 (L) >=40 mg/dL   Total CHOL/HDL Ratio 3.3 <=5.0 Ratio   VLDL 17 <30 mg/dL   LDL Cholesterol 71 <  130 mg/dL  Hemoglobin A1c  Result Value Ref Range   Hgb A1c MFr Bld 5.3 <5.7 %    Mean Plasma Glucose 105 mg/dL  TSH  Result Value Ref Range   TSH 2.00 0.40 - 4.50 mIU/L      Assessment & Plan:   Problem List Items Addressed This Visit      Cardiovascular and Mediastinum   Elevated blood pressure, situational    Doing well off of antihypertensives        Endocrine   Impaired fasting glucose    Last a1c was excellent; I'm okay to check glucose once or twice a year      Relevant Orders   BASIC METABOLIC PANEL WITH GFR   Hypogonadism male    Managed by urologist        Genitourinary   CKD (chronic kidney disease) stage 2, GFR 60-89 ml/min    Monitor Cr periodically; hydration encouraged      Relevant Orders   BASIC METABOLIC PANEL WITH GFR   BPH (benign prostatic hyperplasia)    Managed by urologist        Other   Elevated hemoglobin ([place])    Monitored by urologist, secondary to testosterone therapy; patient giving blood regularly      Dyslipidemia    Continue to try to eat well, continue statin; check fasting lipids      Relevant Orders   Lipid panel       Follow up plan: Return in about 5 weeks (around 02/03/2017) for fasting labs only (or just after); visit with Dr. Sanda Klein in late Sept or early October.  An after-visit summary was printed and given to the patient at [place].  Please see the patient instructions which may contain other information and recommendations beyond what is mentioned above in the assessment and plan.  No orders of the defined types were placed in this encounter.   Orders Placed This Encounter  Procedures  . Lipid panel  . BASIC METABOLIC PANEL WITH GFR

## 2016-12-27 NOTE — Assessment & Plan Note (Signed)
Last a1c was excellent; I'm okay to check glucose once or twice a year

## 2016-12-27 NOTE — Assessment & Plan Note (Signed)
Continue to try to eat well, continue statin; check fasting lipids

## 2016-12-27 NOTE — Assessment & Plan Note (Signed)
Managed by urologist 

## 2016-12-27 NOTE — Assessment & Plan Note (Signed)
Doing well off of antihypertensives

## 2016-12-27 NOTE — Patient Instructions (Signed)
Try to limit saturated fats in your diet (bologna, hot dogs, barbeque, cheeseburgers, hamburgers, steak, bacon, sausage, cheese, etc.) and get more fresh fruits, vegetables, and whole grains   

## 2016-12-27 NOTE — Assessment & Plan Note (Signed)
Monitor Cr periodically; hydration encouraged

## 2016-12-27 NOTE — Assessment & Plan Note (Signed)
Monitored by urologist, secondary to testosterone therapy; patient giving blood regularly

## 2017-01-31 ENCOUNTER — Other Ambulatory Visit: Payer: Self-pay

## 2017-01-31 ENCOUNTER — Other Ambulatory Visit: Payer: 59

## 2017-01-31 DIAGNOSIS — N182 Chronic kidney disease, stage 2 (mild): Secondary | ICD-10-CM

## 2017-01-31 DIAGNOSIS — R7301 Impaired fasting glucose: Secondary | ICD-10-CM

## 2017-01-31 DIAGNOSIS — E785 Hyperlipidemia, unspecified: Secondary | ICD-10-CM

## 2017-01-31 LAB — BASIC METABOLIC PANEL WITH GFR
BUN: 14 mg/dL (ref 7–25)
CO2: 29 mmol/L (ref 20–31)
Calcium: 8.9 mg/dL (ref 8.6–10.3)
Chloride: 103 mmol/L (ref 98–110)
Creat: 1.08 mg/dL (ref 0.70–1.33)
GFR, Est Non African American: 79 mL/min (ref >=60)
Glucose, Bld: 91 mg/dL (ref 65–99)
POTASSIUM: 4.2 mmol/L (ref 3.5–5.3)
Sodium: 138 mmol/L (ref 135–146)

## 2017-01-31 LAB — LIPID PANEL
Cholesterol: 154 mg/dL (ref <200)
HDL: 36 mg/dL — ABNORMAL LOW (ref >40)
LDL CALC: 97 mg/dL (ref <100)
Total CHOL/HDL Ratio: 4.3 Ratio (ref <5.0)
Triglycerides: 103 mg/dL (ref <150)
VLDL: 21 mg/dL (ref <30)

## 2017-02-11 DIAGNOSIS — E291 Testicular hypofunction: Secondary | ICD-10-CM | POA: Diagnosis not present

## 2017-02-11 DIAGNOSIS — R339 Retention of urine, unspecified: Secondary | ICD-10-CM | POA: Diagnosis not present

## 2017-02-11 DIAGNOSIS — Z8042 Family history of malignant neoplasm of prostate: Secondary | ICD-10-CM | POA: Diagnosis not present

## 2017-02-11 DIAGNOSIS — N138 Other obstructive and reflux uropathy: Secondary | ICD-10-CM | POA: Diagnosis not present

## 2017-02-11 DIAGNOSIS — N401 Enlarged prostate with lower urinary tract symptoms: Secondary | ICD-10-CM | POA: Diagnosis not present

## 2017-03-03 ENCOUNTER — Other Ambulatory Visit: Payer: Self-pay | Admitting: Family Medicine

## 2017-03-03 DIAGNOSIS — G4700 Insomnia, unspecified: Secondary | ICD-10-CM

## 2017-03-04 NOTE — Telephone Encounter (Signed)
I received a request from the pharmay for clonazepam and lorazepam Patient may choose one or the other; I don't plan on prescribing both [place] web site reviewed; no fills of either during the last six months

## 2017-03-04 NOTE — Telephone Encounter (Signed)
Left detailed vociemail 

## 2017-03-04 NOTE — Telephone Encounter (Signed)
Pt called states you gave him both this rx's to take prn he states the clonazepam is for sleep and the lorazepam is for anxiety.  Please call him to discuss or if you have any questions.  He states does not take these pills together

## 2017-03-04 NOTE — Telephone Encounter (Signed)
I talked with patient; will just use one benzo Urged him to work with therapist for insomnia, restless thoughts before bed; sending list of counselors by mail

## 2017-03-05 DIAGNOSIS — S30860A Insect bite (nonvenomous) of lower back and pelvis, initial encounter: Secondary | ICD-10-CM | POA: Diagnosis not present

## 2017-03-28 ENCOUNTER — Other Ambulatory Visit: Payer: Self-pay | Admitting: Family Medicine

## 2017-03-28 DIAGNOSIS — E785 Hyperlipidemia, unspecified: Secondary | ICD-10-CM

## 2017-03-28 NOTE — Telephone Encounter (Signed)
Last lipids and sgpt reviewed; Rx approved 

## 2017-05-12 ENCOUNTER — Telehealth: Payer: Self-pay

## 2017-05-12 DIAGNOSIS — Z5181 Encounter for therapeutic drug level monitoring: Secondary | ICD-10-CM

## 2017-05-12 NOTE — Telephone Encounter (Signed)
Patient wanted to wait til you got back, and preferred me to send you this message instead of another provider.  He started to take the amlodipine again but half the dose 2.5mg  and it has given him the side effect of dizziness again.  He stated that yall had already discussed and if this happened again to call back and you would change med.

## 2017-05-20 MED ORDER — LOSARTAN POTASSIUM 25 MG PO TABS: 25.0000 mg | ORAL_TABLET | Freq: Every day | ORAL | 0 refills | Status: DC

## 2017-05-20 NOTE — Telephone Encounter (Signed)
Thank him for calling Please add amlodipine to med reactions, dizziness Start lowest dose of losartan Check BMP and BP around July 16th (12 days later) Orders and Rx in

## 2017-05-20 NOTE — Telephone Encounter (Signed)
Patient notified

## 2017-05-27 ENCOUNTER — Other Ambulatory Visit: Payer: Self-pay | Admitting: Family Medicine

## 2017-06-20 ENCOUNTER — Other Ambulatory Visit: Payer: Self-pay | Admitting: Family Medicine

## 2017-06-23 ENCOUNTER — Other Ambulatory Visit: Payer: Self-pay

## 2017-06-23 DIAGNOSIS — Z5181 Encounter for therapeutic drug level monitoring: Secondary | ICD-10-CM

## 2017-06-23 NOTE — Telephone Encounter (Signed)
Please remind pt of lab (bmp) due; thank you I'll send refill

## 2017-06-23 NOTE — Telephone Encounter (Signed)
Patient notified will try to come in tomorrow

## 2017-06-26 ENCOUNTER — Other Ambulatory Visit: Payer: Self-pay | Admitting: Family Medicine

## 2017-06-26 LAB — BASIC METABOLIC PANEL
BUN: 16 mg/dL (ref 7–25)
CALCIUM: 9.6 mg/dL (ref 8.6–10.3)
CO2: 25 mmol/L (ref 20–32)
Chloride: 101 mmol/L (ref 98–110)
Creat: 1.33 mg/dL (ref 0.70–1.33)
GLUCOSE: 102 mg/dL — AB (ref 65–99)
Potassium: 4.1 mmol/L (ref 3.5–5.3)
SODIUM: 140 mmol/L (ref 135–146)

## 2017-06-26 MED ORDER — LOSARTAN POTASSIUM 25 MG PO TABS: 25.0000 mg | ORAL_TABLET | Freq: Every day | ORAL | 6 refills | Status: DC

## 2017-06-26 NOTE — Progress Notes (Signed)
Reviewed BMP; refills of ARB given

## 2017-07-22 DIAGNOSIS — Z8371 Family history of colonic polyps: Secondary | ICD-10-CM | POA: Diagnosis not present

## 2017-07-22 DIAGNOSIS — Z1211 Encounter for screening for malignant neoplasm of colon: Secondary | ICD-10-CM | POA: Diagnosis not present

## 2017-08-11 ENCOUNTER — Ambulatory Visit (INDEPENDENT_AMBULATORY_CARE_PROVIDER_SITE_OTHER): Payer: 59 | Admitting: Family Medicine

## 2017-08-11 ENCOUNTER — Encounter: Payer: Self-pay | Admitting: Family Medicine

## 2017-08-11 VITALS — BP 122/84 | HR 72 | Temp 97.8°F | Resp 14 | Wt 234.6 lb

## 2017-08-11 DIAGNOSIS — Z5181 Encounter for therapeutic drug level monitoring: Secondary | ICD-10-CM

## 2017-08-11 DIAGNOSIS — Z23 Encounter for immunization: Secondary | ICD-10-CM

## 2017-08-11 DIAGNOSIS — N182 Chronic kidney disease, stage 2 (mild): Secondary | ICD-10-CM | POA: Diagnosis not present

## 2017-08-11 DIAGNOSIS — R7301 Impaired fasting glucose: Secondary | ICD-10-CM | POA: Diagnosis not present

## 2017-08-11 DIAGNOSIS — R0683 Snoring: Secondary | ICD-10-CM | POA: Diagnosis not present

## 2017-08-11 DIAGNOSIS — R030 Elevated blood-pressure reading, without diagnosis of hypertension: Secondary | ICD-10-CM

## 2017-08-11 DIAGNOSIS — E785 Hyperlipidemia, unspecified: Secondary | ICD-10-CM

## 2017-08-11 DIAGNOSIS — D582 Other hemoglobinopathies: Secondary | ICD-10-CM

## 2017-08-11 MED ORDER — HYDROCHLOROTHIAZIDE 25 MG PO TABS: 12.5000 mg | ORAL_TABLET | Freq: Every day | ORAL | 3 refills | Status: DC

## 2017-08-11 NOTE — Patient Instructions (Signed)
Stop the losartan Start the new HCTZ Return in 2 weeks for BP recheck and fasting labs Try the DASH guidelines We'll have you checked out for sleep spnea

## 2017-08-11 NOTE — Progress Notes (Signed)
BP 122/84   Pulse 72   Temp 97.8 F (36.6 C) (Oral)   Resp 14   Wt 234 lb 9.6 oz (106.4 kg)   SpO2 95%   BMI 32.26 kg/m    Subjective:    Patient ID: Tom Wilkerson, male    DOB: [birth year], 51 y.o.   MRN: [id number]  HPI: Tom Wilkerson is a 51 y.o. male  Chief Complaint  Patient presents with  . Follow-up    HPI Patient is here for routine f/u He has high blood pressure The medicine is causing headache; he wants to switch; he can lay down for a short nap and the headache goes away; he feels like he has jetlag headache, fixed by additional sleep He tried to give blood 3 months ago and he was almost rejected b/c it was high Not really checking at home, doesn't trust either machine at home, really off He used to be on ACE-I and also amlodipine  He has high cholesterol On statin; no problems; trying to limit bacon; still eating sausage  He came off the flomax; not taking for a year  Prediabetes; he has stopped sodas of all types; no one in the fam with diabetes  Snoring is bad; not waking up refreshed; feels like he wasted 6 hours of night; father has OSA  He went to schedule the colonoscopy and staff there asked him about starting fluid pill; he denies leg swelling, though he did have leg swelling on the CCB  Depression screen [facility] 2/9 08/11/2017 12/27/2016 08/26/2016 06/17/2016  Decreased Interest 0 0 0 0  Down, Depressed, Hopeless 0 0 0 0  PHQ - 2 Score 0 0 0 0    Relevant past medical, surgical, family and social history reviewed Past Medical History:  Diagnosis Date  . Allergic rhinitis, cause unspecified   . Chest pain, unspecified   . Chicken pox   . Chronic prostatitis   . Elevated blood pressure reading without diagnosis of hypertension   . Esophageal reflux   . Insomnia, unspecified   . Irritability   . Nonspecific abnormal electrocardiogram (ECG) (EKG)   . Other alopecia   . Other and unspecified hyperlipidemia   . Other testicular hypofunction   .  Sleep related leg cramps   . Unspecified contusion of eye   . Unspecified hearing loss   . Unspecified hyperplasia of prostate without urinary obstruction and other lower urinary tract symptoms (LUTS)    Past Surgical History:  Procedure Laterality Date  . DENTAL RESTORATION/EXTRACTION WITH X-RAY    . TONSILLECTOMY  01/31/2009   [name]   Family History  Problem Relation Age of Onset  . Hypertension Mother   . Lung cancer Mother   . Depression Mother   . Hyperlipidemia Mother   . Arthritis Mother   . Kidney cancer Mother   . Cancer Mother        kidney, lung  . CAD Father        CABG age 82  . Hypertension Father   . Stroke Father   . Hyperlipidemia Father   . Prostate cancer Father   . Congestive Heart Failure Father   . Cancer Father        prostate  . Cancer Unknown        malignancy prostate  . Migraines Sister   . Liver disease Brother        on liver transplant list  . Ulcerative colitis Brother   . COPD Neg  Hx   . Diabetes Neg Hx   . Heart disease Neg Hx    Social History   Social History  . Marital status: Married    Spouse name: N/A  . Number of children: N/A  . Years of education: N/A   Occupational History  . works from [employer] in [employer] x 12 years; happy   Social History Main Topics  . Smoking status: Never Smoker  . Smokeless tobacco: Never Used  . Alcohol use Yes     Comment: occasional socially twice weekly beer  . Drug use: No  . Sexual activity: Yes   Other Topics Concern  . Not on file   Social History Narrative   Always uses seat belts. Smoke alarm and carbon monoxide detector in the home.Guns in the home stored in locked cabinet. Caffeine use Large more than 8 servings per day. Married x 23 years, happily married,no children.Nutrtion Poorly balanced diet. Exercise: moderate 3 x week, walking,cycling. Pets: cat, dog   Interim medical history since last visit reviewed. Allergies and medications  reviewed  Review of Systems Per HPI unless specifically indicated above     Objective:    BP 122/84   Pulse 72   Temp 97.8 F (36.6 C) (Oral)   Resp 14   Wt 234 lb 9.6 oz (106.4 kg)   SpO2 95%   BMI 32.26 kg/m   Wt Readings from Last 3 Encounters:  08/11/17 234 lb 9.6 oz (106.4 kg)  12/27/16 227 lb 1.6 oz (103 kg)  08/26/16 227 lb 5 oz (103.1 kg)    Physical Exam  Constitutional: He appears well-developed and well-nourished. No distress.  HENT:  Head: Normocephalic and atraumatic.  Eyes: EOM are normal.  Cardiovascular: Normal rate and regular rhythm.   Pulmonary/Chest: Effort normal and breath sounds normal.  Abdominal: He exhibits no distension.  Musculoskeletal: He exhibits no edema.  Neurological: He is alert. He displays no tremor.  Psychiatric: He has a normal mood and affect.   Results for orders placed or performed in visit on 71/69/67  Basic Metabolic Panel (BMET)  Result Value Ref Range   Sodium 140 135 - 146 mmol/L   Potassium 4.1 3.5 - 5.3 mmol/L   Chloride 101 98 - 110 mmol/L   CO2 25 20 - 32 mmol/L   Glucose, Bld 102 (H) 65 - 99 mg/dL   BUN 16 7 - 25 mg/dL   Creat 1.33 0.70 - 1.33 mg/dL   Calcium 9.6 8.6 - 10.3 mg/dL      Assessment & Plan:   Problem List Items Addressed This Visit      Cardiovascular and Mediastinum   Elevated blood pressure, situational - Primary    Will stop the ARB and use low dose thiazide diurtic; recheck BP at home with family; call if over 140/90; DASH      Relevant Medications   hydrochlorothiazide (HYDRODIURIL) 25 MG tablet   Other Relevant Orders   Ambulatory referral to Pulmonology     Endocrine   Impaired fasting glucose    Limit sweets, simple carbs; check A1c      Relevant Orders   Hemoglobin A1c     Genitourinary   CKD (chronic kidney disease) stage 2, GFR 60-89 ml/min    Check Cr        Other   Medication monitoring encounter    Check sgpt and -lytes in 2 weeks      Relevant Orders  COMPLETE METABOLIC PANEL WITH GFR   Elevated hemoglobin (HCC)    Likely secondary to testosterone, managed by Tom Wilkerson; I asked about sleep apnea; refer for sleep study      Relevant Orders   Ambulatory referral to Pulmonology   Dyslipidemia    Check fasting lipids      Relevant Orders   Lipid panel    Other Visit Diagnoses    Needs flu shot       Relevant Orders   Flu Vaccine QUAD 6+ mos PF IM (Fluarix Quad PF) (Completed)   Snoring       with headache, unrefreshed sleep, HTN; refer for evaluation for probably OSA   Relevant Orders   Ambulatory referral to Pulmonology       Follow up plan: Return in about 6 months (around 02/23/2018) for twenty minute follow-up with fasting labs.  An after-visit summary was printed and given to the patient at [facility].  Please see the patient instructions which may contain other information and recommendations beyond what is mentioned above in the assessment and plan.  Meds ordered this encounter  Medications  . nystatin (MYCOSTATIN/NYSTOP) powder    Sig: Apply 100,000 g topically as needed.  . hydrochlorothiazide (HYDRODIURIL) 25 MG tablet    Sig: Take 0.5 tablets (12.5 mg total) by mouth daily.    Dispense:  15 tablet    Refill:  3    Orders Placed This Encounter  Procedures  . Flu Vaccine QUAD 6+ mos PF IM (Fluarix Quad PF)  . COMPLETE METABOLIC PANEL WITH GFR  . Lipid panel  . Hemoglobin A1c  . Ambulatory referral to Pulmonology

## 2017-08-11 NOTE — Assessment & Plan Note (Signed)
Check fasting lipids 

## 2017-08-11 NOTE — Assessment & Plan Note (Signed)
Check sgpt and -lytes in 2 weeks

## 2017-08-11 NOTE — Assessment & Plan Note (Signed)
Will stop the ARB and use low dose thiazide diurtic; recheck BP at home with family; call if over 140/90; DASH

## 2017-08-11 NOTE — Assessment & Plan Note (Signed)
Likely secondary to testosterone, managed by Dr. [name]; I asked about sleep apnea; refer for sleep study

## 2017-08-11 NOTE — Assessment & Plan Note (Signed)
Limit sweets, simple carbs; check A1c

## 2017-08-11 NOTE — Assessment & Plan Note (Signed)
Check Cr 

## 2017-08-19 DIAGNOSIS — Z79899 Other long term (current) drug therapy: Secondary | ICD-10-CM | POA: Diagnosis not present

## 2017-08-19 DIAGNOSIS — N401 Enlarged prostate with lower urinary tract symptoms: Secondary | ICD-10-CM | POA: Diagnosis not present

## 2017-08-19 DIAGNOSIS — N138 Other obstructive and reflux uropathy: Secondary | ICD-10-CM | POA: Diagnosis not present

## 2017-08-19 DIAGNOSIS — E291 Testicular hypofunction: Secondary | ICD-10-CM | POA: Diagnosis not present

## 2017-08-19 DIAGNOSIS — Z8042 Family history of malignant neoplasm of prostate: Secondary | ICD-10-CM | POA: Diagnosis not present

## 2017-08-19 DIAGNOSIS — N139 Obstructive and reflux uropathy, unspecified: Secondary | ICD-10-CM | POA: Diagnosis not present

## 2017-08-20 ENCOUNTER — Encounter: Payer: Self-pay | Admitting: Internal Medicine

## 2017-08-20 ENCOUNTER — Ambulatory Visit (INDEPENDENT_AMBULATORY_CARE_PROVIDER_SITE_OTHER): Payer: 59 | Admitting: Internal Medicine

## 2017-08-20 VITALS — BP 138/86 | HR 75 | Resp 16 | Ht 71.5 in | Wt 235.0 lb

## 2017-08-20 DIAGNOSIS — G4719 Other hypersomnia: Secondary | ICD-10-CM

## 2017-08-20 NOTE — Patient Instructions (Signed)
Will send for sleep study and CPAP desensitization.     Sleep Apnea Sleep apnea is disorder that affects a person's sleep. A person with sleep apnea has abnormal pauses in their breathing when they sleep. It is hard for them to get a good sleep. This makes a person tired during the day. It also can lead to other physical problems. There are three types of sleep apnea. One type is when breathing stops for a short time because your airway is blocked (obstructive sleep apnea). Another type is when the brain sometimes fails to give the normal signal to breathe to the muscles that control your breathing (central sleep apnea). The third type is a combination of the other two types. HOME CARE   Take all medicine as told by your doctor.  Avoid alcohol, calming medicines (sedatives), and depressant drugs.  Try to lose weight if you are overweight. Talk to your doctor about a healthy weight goal.  Your doctor may have you use a device that helps to open your airway. It can help you get the air that you need. It is called a positive airway pressure (PAP) device.   MAKE SURE YOU:   Understand these instructions.  Will watch your condition.  Will get help right away if you are not doing well or get worse.  It may take approximately 1 month for you to get used to wearing her CPAP every night.  Be sure to work with your machine to get used to it, be patient, it may take time!

## 2017-08-20 NOTE — Progress Notes (Signed)
[facility] Consultation      Assessment and Plan:  51 year old male with excessive daytime sleepiness and TMJ syndrome.  Excessive daytime sleepiness, morning headaches, suspicious for obstructive sleep apnea. -We'll send for sleep study. -Patient has concerns about using CPAP, will see if we can send him for a mask fitting and a CPAP desensitization.  TMJ syndrome. -Jaw pain secondary to above, therefore patient would not be a candidate for a dental device.  Orders Placed This Encounter  Procedures  . Home sleep test      Date: 08/20/2017  MRN# [id number] Tom Wilkerson 29-May-1966    Tom Wilkerson is a 51 y.o. old male seen in consultation for chief complaint of:    Chief Complaint  Patient presents with  . Advice Only    Referred by Dr. Sanda Klein for sleep evaluation. Spouse states he snores.    HPI:   The patient is referred for excessive daytime sleepiness. He typically goes to bed between 10 and 11 PM, he falls asleep. The within a few minutes. Wakes up several times per night, he gets out of bed between 6:30 AM and 7 AM. Review of most recent lab work shows a hemoglobin level of 18. On 11/03/12, increase to 15. n 08/05/16. Dr. Sanda Klein had noted that he has been having headaches and elevated BP.  He notes that he is "exhausted". He snores at night.  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 He has to take naps during the day.   No sleep attacks. He has leg cramps with burning in his legs which occasionally keeps him awake.   His dad had OSA with and wore a CPAP, he was "freaked out" by the mask and does not think that he can wear something on his face.    PMHX:   Past Medical History:  Diagnosis Date  . Allergic rhinitis, cause unspecified   . Chest pain, unspecified   . Chicken pox   . Chronic prostatitis   . Elevated blood pressure reading without diagnosis of hypertension   . Esophageal reflux   . Insomnia,  unspecified   . Irritability   . Nonspecific abnormal electrocardiogram (ECG) (EKG)   . Other alopecia   . Other and unspecified hyperlipidemia   . Other testicular hypofunction   . Sleep related leg cramps   . Unspecified contusion of eye   . Unspecified hearing loss   . Unspecified hyperplasia of prostate without urinary obstruction and other lower urinary tract symptoms (LUTS)    Surgical Hx:  Past Surgical History:  Procedure Laterality Date  . DENTAL RESTORATION/EXTRACTION WITH X-RAY    . TONSILLECTOMY  01/31/2009   [name]   Family Hx:  Family History  Problem Relation Age of Onset  . Hypertension Mother   . Lung cancer Mother   . Depression Mother   . Hyperlipidemia Mother   . Arthritis Mother   . Kidney cancer Mother   . Cancer Mother        kidney, lung  . CAD Father        CABG age 87  . Hypertension Father   . Stroke Father   . Hyperlipidemia Father   . Prostate cancer Father   . Congestive Heart Failure Father   . Cancer Father        prostate  . Cancer Unknown        malignancy prostate  . Migraines Sister   . Liver disease Brother  on liver transplant list  . Ulcerative colitis Brother   . COPD Neg Hx   . Diabetes Neg Hx   . Heart disease Neg Hx    Social Hx:   Social History  Substance Use Topics  . Smoking status: Never Smoker  . Smokeless tobacco: Never Used  . Alcohol use Yes     Comment: occasional socially twice weekly beer   Medication:    Current Outpatient Prescriptions:  .  aspirin EC 81 MG tablet, Take 81 mg by mouth daily., Disp: , Rfl:  .  buPROPion (WELLBUTRIN XL) 150 MG 24 hr tablet, TAKE ONE TABLET BY MOUTH EVERY DAY, Disp: 90 tablet, Rfl: 3 .  cyclobenzaprine (FLEXERIL) 5 MG tablet, Take 1 tablet (5 mg total) by mouth every 8 (eight) hours as needed for muscle spasms., Disp: 30 tablet, Rfl: 1 .  hydrochlorothiazide (HYDRODIURIL) 25 MG tablet, Take 0.5 tablets (12.5 mg total) by mouth daily., Disp: 15 tablet, Rfl: 3 .   LORazepam (ATIVAN) 0.5 MG tablet, One by mouth every six hours if needed for stormy days or sleep, Disp: 20 tablet, Rfl: 0 .  nystatin (MYCOSTATIN/NYSTOP) powder, Apply 100,000 g topically as needed., Disp: , Rfl:  .  pravastatin (PRAVACHOL) 20 MG tablet, TAKE ONE TABLET BY MOUTH AT BEDTIME, Disp: 90 tablet, Rfl: 1 .  Probiotic Product (PROBIOTIC PO), Take by mouth daily., Disp: , Rfl:  .  testosterone cypionate (DEPOTESTOTERONE CYPIONATE) 100 MG/ML injection, Inject into the muscle every 7 (seven) days. For IM use only , Disp: , Rfl:    Allergies:  Bee venom; Codeine; Ibuprofen; Nsaids; and Amlodipine  Review of Systems: Gen:  Denies  fever, sweats, chills HEENT: Denies blurred vision, double vision. bleeds, sore throat Cvc:  No dizziness, chest pain. Resp:   Denies cough or sputum production, shortness of breath Gi: Denies swallowing difficulty, stomach pain. Gu:  Denies bladder incontinence, burning urine Ext:   No Joint pain, stiffness. Skin: No skin rash,  hives  Endoc:  No polyuria, polydipsia. Psych: No depression, insomnia. Other:  All other systems were reviewed with the patient and were negative other that what is mentioned in the HPI.   Physical Examination:   VS: BP 138/86 (BP Location: Left Arm, Cuff Size: Large)   Pulse 75   Resp 16   Ht 5' 11.5" (1.816 m)   Wt 235 lb (106.6 kg)   SpO2 96%   BMI 32.32 kg/m   General Appearance: No distress  Neuro:without focal findings,  speech normal,  HEENT: PERRLA, EOM intact.   Pulmonary: normal breath sounds, No wheezing.  CardiovascularNormal S1,S2.  No m/r/g.   Abdomen: Benign, Soft, non-tender. Renal:  No costovertebral tenderness  GU:  No performed at this time. Endoc: No evident thyromegaly, no signs of acromegaly. Skin:   warm, no rashes, no ecchymosis  Extremities: normal, no cyanosis, clubbing.  Other findings:    LABORATORY PANEL:   CBC No results for input(s): WBC, HGB, HCT, PLT in the last 168  hours. ------------------------------------------------------------------------------------------------------------------  Chemistries  No results for input(s): NA, K, CL, CO2, GLUCOSE, BUN, CREATININE, CALCIUM, MG, AST, ALT, ALKPHOS, BILITOT in the last 168 hours.  Invalid input(s): GFRCGP ------------------------------------------------------------------------------------------------------------------  Cardiac Enzymes No results for input(s): TROPONINI in the last 168 hours. ------------------------------------------------------------  RADIOLOGY:  No results found.     Thank  you for the consultation and for allowing [facility] to assist in the care of your patient. Our recommendations are noted above.  Please contact us if we can be of further service.   Marda Stalker, MD.  Board Certified in Internal Medicine, Pulmonary Medicine, [place], and Sleep Medicine.  [facility] Office Number: [phone]  Patricia Pesa, M.D.  Merton Border, M.D  08/20/2017

## 2017-09-02 ENCOUNTER — Other Ambulatory Visit: Payer: Self-pay

## 2017-09-02 DIAGNOSIS — E785 Hyperlipidemia, unspecified: Secondary | ICD-10-CM

## 2017-09-02 DIAGNOSIS — R7301 Impaired fasting glucose: Secondary | ICD-10-CM

## 2017-09-02 DIAGNOSIS — Z5181 Encounter for therapeutic drug level monitoring: Secondary | ICD-10-CM

## 2017-09-03 ENCOUNTER — Telehealth: Payer: Self-pay

## 2017-09-03 LAB — COMPLETE METABOLIC PANEL WITH GFR
AG Ratio: 1.9 (calc) (ref 1.0–2.5)
ALBUMIN MSPROF: 4.4 g/dL (ref 3.6–5.1)
ALT: 16 U/L (ref 9–46)
AST: 14 U/L (ref 10–35)
Alkaline phosphatase (APISO): 86 U/L (ref 40–115)
BUN / CREAT RATIO: 12 (calc) (ref 6–22)
BUN: 16 mg/dL (ref 7–25)
CO2: 32 mmol/L (ref 20–32)
CREATININE: 1.36 mg/dL — AB (ref 0.70–1.33)
Calcium: 9.4 mg/dL (ref 8.6–10.3)
Chloride: 99 mmol/L (ref 98–110)
GFR, EST AFRICAN AMERICAN: 69 mL/min/{1.73_m2} (ref >=60)
GFR, Est Non African American: 59 mL/min/{1.73_m2} — ABNORMAL LOW (ref >=60)
GLUCOSE: 104 mg/dL — AB (ref 65–99)
Globulin: 2.3 g/dL (calc) (ref 1.9–3.7)
Potassium: 3.9 mmol/L (ref 3.5–5.3)
Sodium: 138 mmol/L (ref 135–146)
TOTAL PROTEIN: 6.7 g/dL (ref 6.1–8.1)
Total Bilirubin: 0.8 mg/dL (ref 0.2–1.2)

## 2017-09-03 LAB — HEMOGLOBIN A1C
Hgb A1c MFr Bld: 5.5 % of total Hgb (ref <5.7)
MEAN PLASMA GLUCOSE: 111 (calc)
eAG (mmol/L): 6.2 (calc)

## 2017-09-03 LAB — LIPID PANEL
CHOL/HDL RATIO: 4.2 (calc) (ref <5.0)
CHOLESTEROL: 158 mg/dL (ref <200)
HDL: 38 mg/dL — ABNORMAL LOW (ref >40)
LDL CHOLESTEROL (CALC): 97 mg/dL
Non-HDL Cholesterol (Calc): 120 mg/dL (calc) (ref <130)
TRIGLYCERIDES: 124 mg/dL (ref <150)

## 2017-09-03 NOTE — Telephone Encounter (Signed)
-----   Message from Arnetha Courser, MD sent at 09/03/2017 12:58 PM EDT ----- Please let pt know that his kidney function is overall stable, declined just three-hundredths of a point since last time; just stay well-hydrated and avoid NSAIDs; his LDL is under 100, and his HDL has improved (up from 36 to 38, and the goal is over 40); keep doing the healthy things he is doing; thank you

## 2017-09-03 NOTE — Telephone Encounter (Signed)
Called pt informed him of information below. Pt gave verbal understanding.  

## 2017-09-04 ENCOUNTER — Other Ambulatory Visit
Admission: RE | Admit: 2017-09-04 | Discharge: 2017-09-04 | Disposition: A | Discharge status: Home / Self Care | Payer: 59 | Source: Ambulatory Visit | Attending: Unknown Physician Specialty | Admitting: Unknown Physician Specialty

## 2017-09-04 DIAGNOSIS — K640 First degree hemorrhoids: Secondary | ICD-10-CM | POA: Diagnosis not present

## 2017-09-04 DIAGNOSIS — K648 Other hemorrhoids: Secondary | ICD-10-CM | POA: Diagnosis not present

## 2017-09-04 DIAGNOSIS — R197 Diarrhea, unspecified: Secondary | ICD-10-CM | POA: Insufficient documentation

## 2017-09-04 DIAGNOSIS — Z1211 Encounter for screening for malignant neoplasm of colon: Secondary | ICD-10-CM | POA: Diagnosis not present

## 2017-09-04 DIAGNOSIS — Z8371 Family history of colonic polyps: Secondary | ICD-10-CM | POA: Diagnosis not present

## 2017-09-04 LAB — C DIFFICILE QUICK SCREEN W PCR REFLEX
C DIFFICILE (CDIFF) INTERP: NOT DETECTED
C Diff antigen: NEGATIVE
C Diff toxin: NEGATIVE

## 2017-09-16 ENCOUNTER — Telehealth: Payer: Self-pay | Admitting: *Deleted

## 2017-09-16 NOTE — Addendum Note (Signed)
Addended by: Laverle Hobby on: 09/16/2017 02:52 PM   Modules accepted: Orders

## 2017-09-16 NOTE — Telephone Encounter (Signed)
-----   Message from Laverle Hobby, MD sent at 09/16/2017  2:50 PM EDT ----- Regarding: PAP-Nap after sleep study.  Discussed with Lynnae Sandhoff that we will send the patient for a sleep study to confirm the presence of OSA, then will send pt for PAP-Nap if sleep study positive.

## 2017-09-17 ENCOUNTER — Other Ambulatory Visit (HOSPITAL_BASED_OUTPATIENT_CLINIC_OR_DEPARTMENT_OTHER): Payer: 59

## 2017-09-23 ENCOUNTER — Telehealth: Payer: Self-pay | Admitting: Internal Medicine

## 2017-09-23 DIAGNOSIS — G4719 Other hypersomnia: Secondary | ICD-10-CM

## 2017-09-23 NOTE — Telephone Encounter (Signed)
Order placed for HST. Will contact patient to advise. Rhonda J Cobb

## 2017-09-23 NOTE — Telephone Encounter (Signed)
Ria Comment with [facility] stated that patient's insurance denied in lab study. HST has been recommended by insurance.   Please advise. Rhonda J Cobb If HST ok, please place order and cancel in lab order. Rhonda J Cobb

## 2017-09-23 NOTE — Telephone Encounter (Signed)
Sleep study (home) already in.ss

## 2017-09-26 NOTE — Telephone Encounter (Signed)
LMOAM for pt to return call to schedule HST. Rhonda J Cobb

## 2017-09-29 NOTE — Telephone Encounter (Signed)
Reached out to patient today and pt scheduled to p/u HST device on Wed 10/08/17 between 3:30 - 4:30 pm. Tom Wilkerson

## 2017-10-08 ENCOUNTER — Other Ambulatory Visit: Payer: Self-pay | Admitting: Family Medicine

## 2017-10-22 ENCOUNTER — Encounter: Payer: Self-pay | Admitting: Internal Medicine

## 2017-10-22 DIAGNOSIS — G4719 Other hypersomnia: Secondary | ICD-10-CM

## 2017-10-24 DIAGNOSIS — G4733 Obstructive sleep apnea (adult) (pediatric): Secondary | ICD-10-CM | POA: Diagnosis not present

## 2017-10-31 ENCOUNTER — Telehealth: Payer: Self-pay | Admitting: *Deleted

## 2017-10-31 DIAGNOSIS — G4733 Obstructive sleep apnea (adult) (pediatric): Secondary | ICD-10-CM

## 2017-10-31 NOTE — Telephone Encounter (Signed)
Pt informed of sleep study results. Will place order for new CPAP. Pt concerned he is not going to be able to wear machine. Informed pt once he receives machine for him to give [place] a call and we will get him an appt with Lynnae Sandhoff at [facility] for mask desensitizing. Nothing further needed.

## 2017-11-07 ENCOUNTER — Other Ambulatory Visit: Payer: Self-pay | Admitting: Family Medicine

## 2017-11-07 NOTE — Telephone Encounter (Signed)
I prescribed a 4 month supply on 08/11/17 He should not be out until the 3rd week of January Please resolve with pharmacy

## 2017-11-07 NOTE — Telephone Encounter (Signed)
Advised pharmacy refill too soon.

## 2017-11-27 DIAGNOSIS — G4733 Obstructive sleep apnea (adult) (pediatric): Secondary | ICD-10-CM | POA: Diagnosis not present

## 2017-12-09 ENCOUNTER — Other Ambulatory Visit (HOSPITAL_BASED_OUTPATIENT_CLINIC_OR_DEPARTMENT_OTHER): Payer: 59

## 2017-12-09 ENCOUNTER — Other Ambulatory Visit: Payer: Self-pay | Admitting: Family Medicine

## 2017-12-09 DIAGNOSIS — E785 Hyperlipidemia, unspecified: Secondary | ICD-10-CM

## 2017-12-16 DIAGNOSIS — L570 Actinic keratosis: Secondary | ICD-10-CM | POA: Diagnosis not present

## 2017-12-16 DIAGNOSIS — D225 Melanocytic nevi of trunk: Secondary | ICD-10-CM | POA: Diagnosis not present

## 2017-12-26 ENCOUNTER — Encounter: Payer: Self-pay | Admitting: Internal Medicine

## 2017-12-26 DIAGNOSIS — G4733 Obstructive sleep apnea (adult) (pediatric): Secondary | ICD-10-CM | POA: Diagnosis not present

## 2017-12-28 DIAGNOSIS — G4733 Obstructive sleep apnea (adult) (pediatric): Secondary | ICD-10-CM | POA: Diagnosis not present

## 2018-01-12 NOTE — Progress Notes (Signed)
[facility] Consultation      Assessment and Plan:  52 year old male with excessive daytime sleepiness and TMJ syndrome.  Obstructive sleep apnea. -Home sleep test 10/22/17; AHI equals 17. -Patient has concerns about using CPAP, will see if we can send him for a mask fitting and a CPAP desensitization.  TMJ syndrome. -Jaw pain secondary to above, therefore patient would not be a candidate for a dental device.    Date: 01/12/2018  MRN# [id number] Tom Wilkerson 1966/08/21    Tom Wilkerson is a 52 y.o. old male seen in consultation for chief complaint of:    Chief Complaint  Patient presents with  . Sleep Apnea    DME: [facility]  pt wears about 7 hrs nightly. He is not having any issues.    HPI:   Patient is a 52 year old male who was recently sent for a home sleep test which showed obstructive sleep apnea with an AHI of 17.6. He notes that he is more awake, he no longer has to take naps in the afternoon anymore.  He is now able to sleep on his back. He is no longer snoring.   Personally reviewed download data, 30 days as of 01/12/18.  Usage greater than 4 hours is 30/30 days.  Average usage is 7 hours 43 minutes.  Setting is auto 5-20.  Median pressure is 6.3, 95th percentile is 9, maximum is 10.  Residual AHI 0.7.    Medication:    Current Outpatient Medications:  .  aspirin EC 81 MG tablet, Take 81 mg by mouth daily., Disp: , Rfl:  .  buPROPion (WELLBUTRIN XL) 150 MG 24 hr tablet, TAKE ONE TABLET BY MOUTH EVERY DAY, Disp: 90 tablet, Rfl: 3 .  cyclobenzaprine (FLEXERIL) 5 MG tablet, TAKE ONE TABLET BY MOUTH EVERY EIGHT HOURS AS NEEDED FOR MUSCLE SPASMS, Disp: 30 tablet, Rfl: 1 .  hydrochlorothiazide (HYDRODIURIL) 25 MG tablet, ONE-HALF TABLET BY MOUTH EVERY DAY, Disp: 45 tablet, Rfl: 1 .  LORazepam (ATIVAN) 0.5 MG tablet, One by mouth every six hours if needed for stormy days or sleep, Disp: 20 tablet, Rfl: 0 .  nystatin (MYCOSTATIN/NYSTOP) powder, Apply  100,000 g topically as needed., Disp: , Rfl:  .  pravastatin (PRAVACHOL) 20 MG tablet, TAKE ONE TABLET AT BEDTIME, Disp: 90 tablet, Rfl: 1 .  Probiotic Product (PROBIOTIC PO), Take by mouth daily., Disp: , Rfl:  .  testosterone cypionate (DEPOTESTOTERONE CYPIONATE) 100 MG/ML injection, Inject into the muscle every 7 (seven) days. For IM use only , Disp: , Rfl:    Allergies:  Bee venom; Codeine; Ibuprofen; Nsaids; and Amlodipine  Review of Systems: Gen:  Denies  fever, sweats, chills HEENT: Denies blurred vision, double vision. bleeds, sore throat Cvc:  No dizziness, chest pain. Resp:   Denies cough or sputum production, shortness of breath Gi: Denies swallowing difficulty, stomach pain. Gu:  Denies bladder incontinence, burning urine Ext:   No Joint pain, stiffness. Skin: No skin rash,  hives  Endoc:  No polyuria, polydipsia. Psych: No depression, insomnia. Other:  All other systems were reviewed with the patient and were negative other that what is mentioned in the HPI.   Physical Examination:   VS: BP 110/82 (BP Location: Left Arm, Cuff Size: Large)   Pulse 77   Resp 16   Ht 5' 11.5" (1.816 m)   Wt 238 lb (108 kg)   SpO2 96%   BMI 32.73 kg/m   General Appearance: No distress  Neuro:without focal  findings,  speech normal,  HEENT: PERRLA, EOM intact.   Pulmonary: normal breath sounds, No wheezing.  CardiovascularNormal S1,S2.  No m/r/g.   Abdomen: Benign, Soft, non-tender. Renal:  No costovertebral tenderness  GU:  No performed at this time. Endoc: No evident thyromegaly, no signs of acromegaly. Skin:   warm, no rashes, no ecchymosis  Extremities: normal, no cyanosis, clubbing.  Other findings:    LABORATORY PANEL:   CBC No results for input(s): WBC, HGB, HCT, PLT in the last 168 hours. ------------------------------------------------------------------------------------------------------------------  Chemistries  No results for input(s): NA, K, CL, CO2, GLUCOSE,  BUN, CREATININE, CALCIUM, MG, AST, ALT, ALKPHOS, BILITOT in the last 168 hours.  Invalid input(s): GFRCGP ------------------------------------------------------------------------------------------------------------------  Cardiac Enzymes No results for input(s): TROPONINI in the last 168 hours. ------------------------------------------------------------  RADIOLOGY:  No results found.     Thank  you for the consultation and for allowing [facility] to assist in the care of your patient. Our recommendations are noted above.  Please contact us if we can be of further service.   Marda Stalker, MD.  Board Certified in Internal Medicine, Pulmonary Medicine, [place], and Sleep Medicine.  [facility] Office Number: [phone]  Patricia Pesa, M.D.  Merton Border, M.D  01/12/2018

## 2018-01-13 ENCOUNTER — Ambulatory Visit (INDEPENDENT_AMBULATORY_CARE_PROVIDER_SITE_OTHER): Payer: 59 | Admitting: Internal Medicine

## 2018-01-13 ENCOUNTER — Encounter: Payer: Self-pay | Admitting: Internal Medicine

## 2018-01-13 VITALS — BP 110/82 | HR 77 | Resp 16 | Ht 71.5 in | Wt 238.0 lb

## 2018-01-13 DIAGNOSIS — G4733 Obstructive sleep apnea (adult) (pediatric): Secondary | ICD-10-CM

## 2018-01-13 NOTE — Patient Instructions (Signed)
Doing great with CPAP, continue using it every night.

## 2018-01-25 DIAGNOSIS — G4733 Obstructive sleep apnea (adult) (pediatric): Secondary | ICD-10-CM | POA: Diagnosis not present

## 2018-02-17 DIAGNOSIS — E291 Testicular hypofunction: Secondary | ICD-10-CM | POA: Diagnosis not present

## 2018-02-17 DIAGNOSIS — N138 Other obstructive and reflux uropathy: Secondary | ICD-10-CM | POA: Diagnosis not present

## 2018-02-17 DIAGNOSIS — R339 Retention of urine, unspecified: Secondary | ICD-10-CM | POA: Diagnosis not present

## 2018-02-17 DIAGNOSIS — N401 Enlarged prostate with lower urinary tract symptoms: Secondary | ICD-10-CM | POA: Diagnosis not present

## 2018-02-23 ENCOUNTER — Ambulatory Visit: Payer: 59 | Admitting: Family Medicine

## 2018-02-25 DIAGNOSIS — G4733 Obstructive sleep apnea (adult) (pediatric): Secondary | ICD-10-CM | POA: Diagnosis not present

## 2018-02-27 DIAGNOSIS — M4602 Spinal enthesopathy, cervical region: Secondary | ICD-10-CM | POA: Diagnosis not present

## 2018-02-27 DIAGNOSIS — M9901 Segmental and somatic dysfunction of cervical region: Secondary | ICD-10-CM | POA: Diagnosis not present

## 2018-02-27 DIAGNOSIS — M62830 Muscle spasm of back: Secondary | ICD-10-CM | POA: Diagnosis not present

## 2018-03-27 ENCOUNTER — Encounter: Payer: Self-pay | Admitting: Family Medicine

## 2018-03-27 ENCOUNTER — Ambulatory Visit: Payer: 59 | Admitting: Family Medicine

## 2018-03-27 VITALS — BP 132/80 | HR 71 | Temp 98.4°F | Resp 14 | Ht 72.0 in | Wt 239.0 lb

## 2018-03-27 DIAGNOSIS — Z9989 Dependence on other enabling machines and devices: Secondary | ICD-10-CM | POA: Diagnosis not present

## 2018-03-27 DIAGNOSIS — F419 Anxiety disorder, unspecified: Secondary | ICD-10-CM | POA: Diagnosis not present

## 2018-03-27 DIAGNOSIS — G4733 Obstructive sleep apnea (adult) (pediatric): Secondary | ICD-10-CM | POA: Insufficient documentation

## 2018-03-27 DIAGNOSIS — E785 Hyperlipidemia, unspecified: Secondary | ICD-10-CM

## 2018-03-27 DIAGNOSIS — D582 Other hemoglobinopathies: Secondary | ICD-10-CM | POA: Diagnosis not present

## 2018-03-27 DIAGNOSIS — N182 Chronic kidney disease, stage 2 (mild): Secondary | ICD-10-CM | POA: Diagnosis not present

## 2018-03-27 DIAGNOSIS — R7301 Impaired fasting glucose: Secondary | ICD-10-CM

## 2018-03-27 DIAGNOSIS — Z789 Other specified health status: Secondary | ICD-10-CM

## 2018-03-27 DIAGNOSIS — Z5181 Encounter for therapeutic drug level monitoring: Secondary | ICD-10-CM

## 2018-03-27 MED ORDER — CYCLOBENZAPRINE HCL 5 MG PO TABS: 5.0000 mg | ORAL_TABLET | Freq: Three times a day (TID) | ORAL | 3 refills | Status: DC | PRN

## 2018-03-27 NOTE — Assessment & Plan Note (Signed)
Check lipids; avoid saturated fats when able

## 2018-03-27 NOTE — Assessment & Plan Note (Addendum)
On CPAP; do not want to take benzo while on cpap

## 2018-03-27 NOTE — Assessment & Plan Note (Signed)
He may call in the future for refill of benzo, limited for stormy moments

## 2018-03-27 NOTE — Progress Notes (Signed)
BP 132/80   Pulse 71   Temp 98.4 F (36.9 C) (Oral)   Resp 14   Ht 6' (1.829 m)   Wt 239 lb (108.4 kg)   SpO2 95%   BMI 32.41 kg/m    Subjective:    Patient ID: Tom Wilkerson, male    DOB: [birth year], 52 y.o.   MRN: [id number]  HPI: Tom Wilkerson is a 52 y.o. male  Chief Complaint  Patient presents with  . Follow-up    HPI Patient is here for f/u Not really cycling regularly; will think about getting back into it He has high blood pressure; using medicine; not using salt; does eat a lot and knows that has salt on it High cholesterol; taking pravastatin; does have some muscles; he is better about the saturated fats than he used to be; bacon is just once in a while He is obese; reviewed last 3 weights He has allergies; occasional benadryl; no decngestants Mood medicine; taking medicine; just half of an ativan at times, still has 3-4 pills from the earlier Prediabetes; some dry mouth, using CPAP for the last 5 months On aspirin, brother just had heart attack  Depression screen [facility] 2/9 03/27/2018 08/11/2017 12/27/2016 08/26/2016 06/17/2016  Decreased Interest 0 0 0 0 0  Down, Depressed, Hopeless 0 0 0 0 0  PHQ - 2 Score 0 0 0 0 0    Relevant past medical, surgical, family and social history reviewed Past Medical History:  Diagnosis Date  . Allergic rhinitis, cause unspecified   . Chest pain, unspecified   . Chicken pox   . Chronic prostatitis   . Elevated blood pressure reading without diagnosis of hypertension   . Esophageal reflux   . Insomnia, unspecified   . Irritability   . Nonspecific abnormal electrocardiogram (ECG) (EKG)   . Other alopecia   . Other and unspecified hyperlipidemia   . Other testicular hypofunction   . Sleep related leg cramps   . Unspecified contusion of eye   . Unspecified hearing loss   . Unspecified hyperplasia of prostate without urinary obstruction and other lower urinary tract symptoms (LUTS)    Past Surgical History:  Procedure  Laterality Date  . DENTAL RESTORATION/EXTRACTION WITH X-RAY    . TONSILLECTOMY  01/31/2009   [name]   Family History  Problem Relation Age of Onset  . Hypertension Mother   . Lung cancer Mother   . Depression Mother   . Hyperlipidemia Mother   . Arthritis Mother   . Kidney cancer Mother   . Cancer Mother        kidney, lung  . CAD Father        CABG age 58  . Hypertension Father   . Stroke Father   . Hyperlipidemia Father   . Prostate cancer Father   . Congestive Heart Failure Father   . Cancer Father        prostate  . Cancer Unknown        malignancy prostate  . Migraines Sister   . Liver disease Brother        on liver transplant list  . Ulcerative colitis Brother   . COPD Neg Hx   . Diabetes Neg Hx   . Heart disease Neg Hx    Social History   Tobacco Use  . Smoking status: Never Smoker  . Smokeless tobacco: Never Used  Substance Use Topics  . Alcohol use: Yes    Comment: occasional socially twice weekly  beer  . Drug use: No    Interim medical history since last visit reviewed. Allergies and medications reviewed  Review of Systems Per HPI unless specifically indicated above     Objective:    BP 132/80   Pulse 71   Temp 98.4 F (36.9 C) (Oral)   Resp 14   Ht 6' (1.829 m)   Wt 239 lb (108.4 kg)   SpO2 95%   BMI 32.41 kg/m   Wt Readings from Last 3 Encounters:  03/27/18 239 lb (108.4 kg)  01/13/18 238 lb (108 kg)  08/20/17 235 lb (106.6 kg)    Physical Exam  Constitutional: He appears well-developed and well-nourished. No distress.  HENT:  Head: Normocephalic and atraumatic.  Eyes: EOM are normal. No scleral icterus.  Neck: No thyromegaly present.  Cardiovascular: Normal rate and regular rhythm.  Pulmonary/Chest: Effort normal and breath sounds normal.  Abdominal: Soft. Bowel sounds are normal. He exhibits no distension.  Musculoskeletal: He exhibits no edema.  Neurological: Coordination normal.  Skin: Skin is warm and dry. No pallor.    Psychiatric: He has a normal mood and affect. His behavior is normal. Judgment and thought content normal.       Assessment & Plan:   Problem List Items Addressed This Visit      Respiratory   OSA on CPAP    On CPAP; do not want to take benzo while on cpap        Endocrine   Impaired fasting glucose - Primary   Relevant Orders   Hemoglobin A1c (Completed)     Genitourinary   CKD (chronic kidney disease) stage 2, GFR 60-89 ml/min    Continue to monitor; avoiding salt, NSAIDs        Other   Medication monitoring encounter    Check liver and kidneys      Relevant Orders   COMPLETE METABOLIC PANEL WITH GFR (Completed)   Elevated hemoglobin (HCC)    Monitored by Dr. [name]; donating blood four times a year      Dyslipidemia    Check lipids; avoid saturated fats when able      Relevant Orders   Lipid panel (Completed)   Anxiety    He may call in the future for refill of benzo, limited for stormy moments       Other Visit Diagnoses    Rubella immune status not known       Relevant Orders   Measles/Mumps/Rubella Immunity       Follow up plan: Return in about 6 months (around 09/27/2018) for follow-up visit with Dr. Sanda Klein.  An after-visit summary was printed and given to the patient at [place].  Please see the patient instructions which may contain other information and recommendations beyond what is mentioned above in the assessment and plan.  Meds ordered this encounter  Medications  . cyclobenzaprine (FLEXERIL) 5 MG tablet    Sig: Take 1-2 tablets (5-10 mg total) by mouth every 8 (eight) hours as needed for muscle spasms.    Dispense:  30 tablet    Refill:  3    Orders Placed This Encounter  Procedures  . COMPLETE METABOLIC PANEL WITH GFR  . Hemoglobin A1c  . Lipid panel  . Measles/Mumps/Rubella Immunity  . Measles/Mumps/Rubella Immunity

## 2018-03-27 NOTE — Patient Instructions (Addendum)
Try to follow the DASH guidelines (DASH stands for Dietary Approaches to Stop Hypertension). Try to limit the sodium in your diet to no more than 1,500mg  of sodium per day. Certainly try to not exceed 2,000 mg per day at the very most. Do not add salt when cooking or at the table.  Check the sodium amount on labels when shopping, and choose items lower in sodium when given a choice. Avoid or limit foods that already contain a lot of sodium. Eat a diet rich in fruits and vegetables and whole grains, and try to lose weight if overweight or obese Check out the information at familydoctor.org entitled "Nutrition for Weight Loss: What You Need to Know about Fad Diets" Try to lose between 1-2 pounds per week by taking in fewer calories and burning off more calories You can succeed by limiting portions, limiting foods dense in calories and fat, becoming more active, and drinking 8 glasses of water a day (64 ounces) Don't skip meals, especially breakfast, as skipping meals may alter your metabolism Do not use over-the-counter weight loss pills or gimmicks that claim rapid weight loss A healthy BMI (or body mass index) is between 18.5 and 24.9 You can calculate your ideal BMI at the [employer] website ClubMonetize.fr Try to use PLAIN allergy medicine without the decongestant Avoid: phenylephrine, phenylpropanolamine, and pseudoephredine Try CoQ10 to see if that helps the aches

## 2018-03-27 NOTE — Assessment & Plan Note (Signed)
Monitored by Dr. [name]; donating blood four times a year

## 2018-03-27 NOTE — Assessment & Plan Note (Signed)
Continue to monitor; avoiding salt, NSAIDs

## 2018-03-27 NOTE — Assessment & Plan Note (Signed)
Check liver and kidneys 

## 2018-03-30 LAB — COMPLETE METABOLIC PANEL WITH GFR
AG RATIO: 2 (calc) (ref 1.0–2.5)
ALBUMIN MSPROF: 4.4 g/dL (ref 3.6–5.1)
ALT: 15 U/L (ref 9–46)
AST: 14 U/L (ref 10–35)
Alkaline phosphatase (APISO): 84 U/L (ref 40–115)
BUN: 15 mg/dL (ref 7–25)
CALCIUM: 9.4 mg/dL (ref 8.6–10.3)
CO2: 29 mmol/L (ref 20–32)
CREATININE: 1.33 mg/dL (ref 0.70–1.33)
Chloride: 101 mmol/L (ref 98–110)
GFR, EST AFRICAN AMERICAN: 71 mL/min/{1.73_m2} (ref >=60)
GFR, Est Non African American: 61 mL/min/{1.73_m2} (ref >=60)
GLOBULIN: 2.2 g/dL (ref 1.9–3.7)
Glucose, Bld: 93 mg/dL (ref 65–99)
POTASSIUM: 3.9 mmol/L (ref 3.5–5.3)
SODIUM: 140 mmol/L (ref 135–146)
TOTAL PROTEIN: 6.6 g/dL (ref 6.1–8.1)
Total Bilirubin: 0.9 mg/dL (ref 0.2–1.2)

## 2018-03-30 LAB — LIPID PANEL
Cholesterol: 141 mg/dL (ref <200)
HDL: 37 mg/dL — AB (ref >40)
LDL CHOLESTEROL (CALC): 85 mg/dL
NON-HDL CHOLESTEROL (CALC): 104 mg/dL (ref <130)
TRIGLYCERIDES: 95 mg/dL (ref <150)
Total CHOL/HDL Ratio: 3.8 (calc) (ref <5.0)

## 2018-03-30 LAB — MEASLES/MUMPS/RUBELLA IMMUNITY
Mumps IgG: 277 AU/mL
Rubella: 2.54 index
Rubeola IgG: 214 AU/mL

## 2018-03-30 LAB — HEMOGLOBIN A1C
Hgb A1c MFr Bld: 5.6 % of total Hgb (ref <5.7)
Mean Plasma Glucose: 114 (calc)
eAG (mmol/L): 6.3 (calc)

## 2018-04-14 DIAGNOSIS — M9901 Segmental and somatic dysfunction of cervical region: Secondary | ICD-10-CM | POA: Diagnosis not present

## 2018-04-14 DIAGNOSIS — M4602 Spinal enthesopathy, cervical region: Secondary | ICD-10-CM | POA: Diagnosis not present

## 2018-04-14 DIAGNOSIS — M62830 Muscle spasm of back: Secondary | ICD-10-CM | POA: Diagnosis not present

## 2018-04-27 DIAGNOSIS — G4733 Obstructive sleep apnea (adult) (pediatric): Secondary | ICD-10-CM | POA: Diagnosis not present

## 2018-05-14 ENCOUNTER — Other Ambulatory Visit: Payer: Self-pay | Admitting: Family Medicine

## 2018-05-27 DIAGNOSIS — G4733 Obstructive sleep apnea (adult) (pediatric): Secondary | ICD-10-CM | POA: Diagnosis not present

## 2018-06-02 DIAGNOSIS — G4733 Obstructive sleep apnea (adult) (pediatric): Secondary | ICD-10-CM | POA: Diagnosis not present

## 2018-06-16 ENCOUNTER — Other Ambulatory Visit: Payer: Self-pay | Admitting: Family Medicine

## 2018-06-16 DIAGNOSIS — E785 Hyperlipidemia, unspecified: Secondary | ICD-10-CM

## 2018-06-16 NOTE — Telephone Encounter (Signed)
Last labs reviewed; Rxs approved 

## 2018-06-27 DIAGNOSIS — G4733 Obstructive sleep apnea (adult) (pediatric): Secondary | ICD-10-CM | POA: Diagnosis not present

## 2018-07-28 DIAGNOSIS — G4733 Obstructive sleep apnea (adult) (pediatric): Secondary | ICD-10-CM | POA: Diagnosis not present

## 2018-08-24 ENCOUNTER — Telehealth: Payer: Self-pay | Admitting: Internal Medicine

## 2018-08-24 MED ORDER — AMBULATORY NON FORMULARY MEDICATION: 0 refills | Status: AC

## 2018-08-24 NOTE — Telephone Encounter (Signed)
Spoke to patient regarding RX for travel CPAP machine. RX printed out and placed at front desk for pickup. Patient aware.

## 2018-08-24 NOTE — Telephone Encounter (Signed)
Patient calling Patient is on CPAP machine but does a lot of travelling Patient has a major trip coming up and is looking into getting a travel CPAP machine  Patient will need written copies or e-mail copies of his prescription if possible Please call to discuss

## 2018-08-27 DIAGNOSIS — G4733 Obstructive sleep apnea (adult) (pediatric): Secondary | ICD-10-CM | POA: Diagnosis not present

## 2018-09-03 DIAGNOSIS — M62830 Muscle spasm of back: Secondary | ICD-10-CM | POA: Diagnosis not present

## 2018-09-03 DIAGNOSIS — M9903 Segmental and somatic dysfunction of lumbar region: Secondary | ICD-10-CM | POA: Diagnosis not present

## 2018-09-03 DIAGNOSIS — M5441 Lumbago with sciatica, right side: Secondary | ICD-10-CM | POA: Diagnosis not present

## 2018-09-08 DIAGNOSIS — M9903 Segmental and somatic dysfunction of lumbar region: Secondary | ICD-10-CM | POA: Diagnosis not present

## 2018-09-08 DIAGNOSIS — M62830 Muscle spasm of back: Secondary | ICD-10-CM | POA: Diagnosis not present

## 2018-09-08 DIAGNOSIS — M5441 Lumbago with sciatica, right side: Secondary | ICD-10-CM | POA: Diagnosis not present

## 2018-09-25 DIAGNOSIS — N400 Enlarged prostate without lower urinary tract symptoms: Secondary | ICD-10-CM | POA: Diagnosis not present

## 2018-09-25 DIAGNOSIS — E291 Testicular hypofunction: Secondary | ICD-10-CM | POA: Diagnosis not present

## 2018-09-25 DIAGNOSIS — N529 Male erectile dysfunction, unspecified: Secondary | ICD-10-CM | POA: Diagnosis not present

## 2018-10-02 ENCOUNTER — Ambulatory Visit: Payer: 59 | Admitting: Family Medicine

## 2018-10-02 ENCOUNTER — Encounter: Payer: Self-pay | Admitting: Family Medicine

## 2018-10-02 VITALS — BP 138/82 | HR 94 | Temp 98.1°F | Ht 72.0 in | Wt 244.6 lb

## 2018-10-02 DIAGNOSIS — R7301 Impaired fasting glucose: Secondary | ICD-10-CM

## 2018-10-02 DIAGNOSIS — E785 Hyperlipidemia, unspecified: Secondary | ICD-10-CM

## 2018-10-02 DIAGNOSIS — F419 Anxiety disorder, unspecified: Secondary | ICD-10-CM

## 2018-10-02 DIAGNOSIS — Z23 Encounter for immunization: Secondary | ICD-10-CM | POA: Diagnosis not present

## 2018-10-02 DIAGNOSIS — N182 Chronic kidney disease, stage 2 (mild): Secondary | ICD-10-CM | POA: Diagnosis not present

## 2018-10-02 DIAGNOSIS — R030 Elevated blood-pressure reading, without diagnosis of hypertension: Secondary | ICD-10-CM

## 2018-10-02 DIAGNOSIS — M5431 Sciatica, right side: Secondary | ICD-10-CM

## 2018-10-02 DIAGNOSIS — Z5181 Encounter for therapeutic drug level monitoring: Secondary | ICD-10-CM

## 2018-10-02 MED ORDER — LORAZEPAM 0.5 MG PO TABS: ORAL_TABLET | ORAL | 0 refills | Status: DC

## 2018-10-02 MED ORDER — CYCLOBENZAPRINE HCL 5 MG PO TABS: 5.0000 mg | ORAL_TABLET | Freq: Three times a day (TID) | ORAL | 3 refills | Status: DC | PRN

## 2018-10-02 MED ORDER — PRAVASTATIN SODIUM 20 MG PO TABS: 20.0000 mg | ORAL_TABLET | ORAL | 1 refills | Status: DC

## 2018-10-02 NOTE — Assessment & Plan Note (Signed)
Try DASH guidelines 

## 2018-10-02 NOTE — Progress Notes (Signed)
BP 138/82   Pulse 94   Temp 98.1 F (36.7 C)   Ht 6' (1.829 m)   Wt 244 lb 9.6 oz (110.9 kg)   SpO2 96%   BMI 33.17 kg/m    Subjective:    Patient ID: Tom Wilkerson, male    DOB: 22-Aug-1966, 52 y.o.   MRN: [id number]  HPI: Tom Wilkerson is a 52 y.o. male  Chief Complaint  Patient presents with  . Follow-up    would like to discuss dose change of pravastatin due to muscle aches and cramps  . Back Pain    HPI Here for f/u He is sick, went to a football game last week, had a sore throat Monday morning; feeling better than he did yesterday; took Alka-Seltzer cold plus; felt a little achy at a few points, but never escalated Got flu shot last Friday  Hypertension; not checking BP much away from the doctor; not adding much salt to food; hardly ever touching the salt shaker  Prediabetes; last A1c was back to normal; no dry mouth, no blurred vision, not craving sweets  Lab Results  Component Value Date   HGBA1C 5.6 03/27/2018   Gaining weight; eating big portions; has almost completely broken the soda habit; drinking coffee, one or two cups in the morning; one teaspoon of sugar  High cholesterol; trying to avoid fatty pig meats, "much better than before I started seeing you"; clean protein; whole eggs for breakfast, huge egg eater; he wanted to back off of the cholesterol because of the aches; CoQ10 helped the aches some; he wants to decrease the pravastatin  BPH; no trouble getting urine stream started; hx of prostatitis and stays hydrated, that is a trigger; low testosterone, monitored by urologist  CKD stage 2; good hydration; avoiding NSAIDs; used to take Orudis K but now allergic  He is bothered by sciatica; trying to walk; leg pain affects how he walks; RIGHT side; he is doing massage and adjustments which is helping a little bit; quit sitting on the wallet; sitting in the car is extremely painful; he has pain in the buttock and the chiropractor says it is all  sciatica  Doing great on the wellbutrin, absolute marriage saver he says  Depression screen [facility] 2/9 10/02/2018 03/27/2018 08/11/2017 12/27/2016 08/26/2016  Decreased Interest 0 0 0 0 0  Down, Depressed, Hopeless 0 0 0 0 0  PHQ - 2 Score 0 0 0 0 0  Altered sleeping 0 - - - -  Tired, decreased energy 0 - - - -  Change in appetite 0 - - - -  Feeling bad or failure about yourself  0 - - - -  Trouble concentrating 0 - - - -  Moving slowly or fidgety/restless 0 - - - -  Suicidal thoughts 0 - - - -  PHQ-9 Score 0 - - - -  Difficult doing work/chores Not difficult at all - - - -   Fall Risk  10/02/2018 03/27/2018 08/11/2017 12/27/2016 08/26/2016  Falls in the past year? 0 No No No No    Relevant past medical, surgical, family and social history reviewed Past Medical History:  Diagnosis Date  . Allergic rhinitis, cause unspecified   . Chest pain, unspecified   . Chicken pox   . Chronic prostatitis   . Elevated blood pressure reading without diagnosis of hypertension   . Esophageal reflux   . Insomnia, unspecified   . Irritability   . Nonspecific abnormal electrocardiogram (ECG) (  EKG)   . Other alopecia   . Other and unspecified hyperlipidemia   . Other testicular hypofunction   . Sleep related leg cramps   . Unspecified contusion of eye   . Unspecified hearing loss   . Unspecified hyperplasia of prostate without urinary obstruction and other lower urinary tract symptoms (LUTS)    Past Surgical History:  Procedure Laterality Date  . DENTAL RESTORATION/EXTRACTION WITH X-RAY    . TONSILLECTOMY  01/31/2009   [name]   Family History  Problem Relation Age of Onset  . Hypertension Mother   . Lung cancer Mother   . Depression Mother   . Hyperlipidemia Mother   . Arthritis Mother   . Kidney cancer Mother   . Cancer Mother        kidney, lung  . CAD Father        CABG age 33  . Hypertension Father   . Stroke Father   . Hyperlipidemia Father   . Prostate cancer Father   .  Congestive Heart Failure Father   . Cancer Father        prostate  . Cancer Unknown        malignancy prostate  . Migraines Sister   . Liver disease Brother        on liver transplant list  . Ulcerative colitis Brother   . COPD Neg Hx   . Diabetes Neg Hx   . Heart disease Neg Hx    Social History   Tobacco Use  . Smoking status: Never Smoker  . Smokeless tobacco: Never Used  Substance Use Topics  . Alcohol use: Yes    Comment: occasional socially twice weekly beer  . Drug use: No     [facility] Visit from 10/02/2018 in [facility]  AUDIT-C Score  2      Interim medical history since last visit reviewed. Allergies and medications reviewed  Review of Systems Per HPI unless specifically indicated above     Objective:    BP 138/82   Pulse 94   Temp 98.1 F (36.7 C)   Ht 6' (1.829 m)   Wt 244 lb 9.6 oz (110.9 kg)   SpO2 96%   BMI 33.17 kg/m   Wt Readings from Last 3 Encounters:  10/02/18 244 lb 9.6 oz (110.9 kg)  03/27/18 239 lb (108.4 kg)  01/13/18 238 lb (108 kg)    Physical Exam  Constitutional: He appears well-developed and well-nourished. No distress.  HENT:  Head: Normocephalic and atraumatic.  Eyes: EOM are normal. No scleral icterus.  Neck: No thyromegaly present.  Cardiovascular: Normal rate and regular rhythm.  Pulmonary/Chest: Effort normal and breath sounds normal.  Abdominal: Soft. Bowel sounds are normal. He exhibits no distension.  Musculoskeletal: He exhibits no edema.       Back:  Neurological: Coordination normal.  LUE strength 5/5  Skin: Skin is warm and dry. No pallor.  Psychiatric: He has a normal mood and affect. His mood appears not anxious. He does not exhibit a depressed mood.    Results for orders placed or performed in visit on 03/27/18  COMPLETE METABOLIC PANEL WITH GFR  Result Value Ref Range   Glucose, Bld 93 65 - 99 mg/dL   BUN 15 7 - 25 mg/dL   Creat 1.33 0.70 - 1.33 mg/dL   GFR, Est Non African  American 61 > OR = 60 mL/min/1.49m2   GFR, Est African American 71 > OR = 60 mL/min/1.79m2   BUN/Creatinine  Ratio NOT APPLICABLE 6 - 22 (calc)   Sodium 140 135 - 146 mmol/L   Potassium 3.9 3.5 - 5.3 mmol/L   Chloride 101 98 - 110 mmol/L   CO2 29 20 - 32 mmol/L   Calcium 9.4 8.6 - 10.3 mg/dL   Total Protein 6.6 6.1 - 8.1 g/dL   Albumin 4.4 3.6 - 5.1 g/dL   Globulin 2.2 1.9 - 3.7 g/dL (calc)   AG Ratio 2.0 1.0 - 2.5 (calc)   Total Bilirubin 0.9 0.2 - 1.2 mg/dL   Alkaline phosphatase (APISO) 84 40 - 115 U/L   AST 14 10 - 35 U/L   ALT 15 9 - 46 U/L  Hemoglobin A1c  Result Value Ref Range   Hgb A1c MFr Bld 5.6 <5.7 % of total Hgb   Mean Plasma Glucose 114 (calc)   eAG (mmol/L) 6.3 (calc)  Lipid panel  Result Value Ref Range   Cholesterol 141 <200 mg/dL   HDL 37 (L) >40 mg/dL   Triglycerides 95 <150 mg/dL   LDL Cholesterol (Calc) 85 mg/dL (calc)   Total CHOL/HDL Ratio 3.8 <5.0 (calc)   Non-HDL Cholesterol (Calc) 104 <130 mg/dL (calc)  Measles/Mumps/Rubella Immunity  Result Value Ref Range   Rubeola IgG 214.00 AU/mL   Mumps IgG 277.00 AU/mL   Rubella 2.54 index      Assessment & Plan:   Problem List Items Addressed This Visit      Cardiovascular and Mediastinum   Elevated blood pressure, situational    Try DASH guidelines      Relevant Medications   pravastatin (PRAVACHOL) 20 MG tablet     Endocrine   Impaired fasting glucose    Check glucose and A1c; work on modest weight loss      Relevant Orders   Hemoglobin A1c     Genitourinary   CKD (chronic kidney disease) stage 2, GFR 60-89 ml/min    Evaluated already by nephrollogist; monitor Cr and GFR; avoid NSAIDs, hydrate        Other   Medication monitoring encounter    Monitor SGPT, Cr, K+      Relevant Orders   Comprehensive metabolic panel   Dyslipidemia - Primary    He wants to back off of the pravastatin, will decrease to every other day; return for non-fasting labs in 6 weeks      Relevant  Medications   pravastatin (PRAVACHOL) 20 MG tablet   Other Relevant Orders   Lipid panel   Anxiety    Doing well on medicines; very limited Rx of lorazepam provided, using less than 20 a year; no alcohol with this      Relevant Medications   LORazepam (ATIVAN) 0.5 MG tablet    Other Visit Diagnoses    Sciatica of right side       he can continue to work with chiropractor; flexeril refilled; no red flags (no loss of control of B/B)   Relevant Medications   cyclobenzaprine (FLEXERIL) 5 MG tablet   LORazepam (ATIVAN) 0.5 MG tablet       Follow up plan: No follow-ups on file.  An after-visit summary was printed and given to the patient at [place].  Please see the patient instructions which may contain other information and recommendations beyond what is mentioned above in the assessment and plan.  Meds ordered this encounter  Medications  . pravastatin (PRAVACHOL) 20 MG tablet    Sig: Take 1 tablet (20 mg total) by mouth every other day. (at bedtime)  Dispense:  45 tablet    Refill:  1  . cyclobenzaprine (FLEXERIL) 5 MG tablet    Sig: Take 1-2 tablets (5-10 mg total) by mouth every 8 (eight) hours as needed for muscle spasms.    Dispense:  50 tablet    Refill:  3  . LORazepam (ATIVAN) 0.5 MG tablet    Sig: One by mouth every six hours if needed for stormy days or sleep    Dispense:  15 tablet    Refill:  0    [facility]    Orders Placed This Encounter  Procedures  . Comprehensive metabolic panel  . Hemoglobin A1c  . Lipid panel

## 2018-10-02 NOTE — Patient Instructions (Addendum)
You have received the Shingrix vaccine today Return in 2-6 months for your 2nd Shingrix booster Go to the hospital lab or [facility] in 6 weeks for labs Please do feel free to have your urologist give you lab orders Check out the information at familydoctor.org entitled "Nutrition for Weight Loss: What You Need to Know about Fad Diets" Try to lose between 1-2 pounds per week by taking in fewer calories and burning off more calories You can succeed by limiting portions, limiting foods dense in calories and fat, becoming more active, and drinking 8 glasses of water a day (64 ounces) Don't skip meals, especially breakfast, as skipping meals may alter your metabolism Do not use over-the-counter weight loss pills or gimmicks that claim rapid weight loss A healthy BMI (or body mass index) is between 18.5 and 24.9 You can calculate your ideal BMI at the [employer] website ClubMonetize.fr Try to follow the DASH guidelines (DASH stands for Dietary Approaches to Stop Hypertension). Try to limit the sodium in your diet to no more than 1,500mg  of sodium per day. Certainly try to not exceed 2,000 mg per day at the very most. Do not add salt when cooking or at the table.  Check the sodium amount on labels when shopping, and choose items lower in sodium when given a choice. Avoid or limit foods that already contain a lot of sodium. Eat a diet rich in fruits and vegetables and whole grains, and try to lose weight if overweight or obese Try to limit saturated fats in your diet (bologna, hot dogs, barbeque, cheeseburgers, hamburgers, steak, bacon, sausage, cheese, etc.) and get more fresh fruits, vegetables, and whole grains

## 2018-10-02 NOTE — Assessment & Plan Note (Signed)
Monitor SGPT, Cr, K+

## 2018-10-02 NOTE — Assessment & Plan Note (Addendum)
Doing well on medicines; very limited Rx of lorazepam provided, using less than 20 a year; no alcohol with this

## 2018-10-02 NOTE — Assessment & Plan Note (Signed)
Check glucose and A1c; work on modest weight loss

## 2018-10-02 NOTE — Assessment & Plan Note (Signed)
Evaluated already by nephrollogist; monitor Cr and GFR; avoid NSAIDs, hydrate

## 2018-10-02 NOTE — Addendum Note (Signed)
Addended by: Sibyl Parr on: 10/02/2018 10:50 AM   Modules accepted: Orders

## 2018-10-02 NOTE — Assessment & Plan Note (Signed)
He wants to back off of the pravastatin, will decrease to every other day; return for non-fasting labs in 6 weeks

## 2018-10-21 DIAGNOSIS — M5441 Lumbago with sciatica, right side: Secondary | ICD-10-CM | POA: Diagnosis not present

## 2018-10-21 DIAGNOSIS — M62830 Muscle spasm of back: Secondary | ICD-10-CM | POA: Diagnosis not present

## 2018-10-21 DIAGNOSIS — M9903 Segmental and somatic dysfunction of lumbar region: Secondary | ICD-10-CM | POA: Diagnosis not present

## 2018-10-29 DIAGNOSIS — M5441 Lumbago with sciatica, right side: Secondary | ICD-10-CM | POA: Diagnosis not present

## 2018-10-29 DIAGNOSIS — M9903 Segmental and somatic dysfunction of lumbar region: Secondary | ICD-10-CM | POA: Diagnosis not present

## 2018-10-29 DIAGNOSIS — M62830 Muscle spasm of back: Secondary | ICD-10-CM | POA: Diagnosis not present

## 2018-11-13 ENCOUNTER — Other Ambulatory Visit: Payer: Self-pay | Admitting: Family Medicine

## 2018-11-13 NOTE — Telephone Encounter (Signed)
Pharmacy requesting Rx to just leave on file Too soon He will be getting labs soon No Rx until labs are resulted Early Rx declined

## 2018-12-07 DIAGNOSIS — G4733 Obstructive sleep apnea (adult) (pediatric): Secondary | ICD-10-CM | POA: Diagnosis not present

## 2018-12-15 DIAGNOSIS — L570 Actinic keratosis: Secondary | ICD-10-CM | POA: Diagnosis not present

## 2018-12-15 DIAGNOSIS — X32XXXA Exposure to sunlight, initial encounter: Secondary | ICD-10-CM | POA: Diagnosis not present

## 2018-12-15 DIAGNOSIS — D2372 Other benign neoplasm of skin of left lower limb, including hip: Secondary | ICD-10-CM | POA: Diagnosis not present

## 2018-12-18 ENCOUNTER — Telehealth: Payer: Self-pay | Admitting: Family Medicine

## 2018-12-18 NOTE — Telephone Encounter (Signed)
Just a reminder from last visit's AVS: "Return in 2-6 months for your 2nd Shingrix booster Go to the hospital lab or [facility] in 6 weeks for labs"  Ask him to have his labs done Ask him to schedule CMA visit for 2nd shingrix  I'll refill med this time and look forward to seeing his blood work results soon

## 2018-12-21 NOTE — Telephone Encounter (Signed)
Per your request pt is scheduled to come in on tomorrow for his 2nd shingrix shot and will then pick up his lab order.

## 2018-12-22 ENCOUNTER — Ambulatory Visit (INDEPENDENT_AMBULATORY_CARE_PROVIDER_SITE_OTHER): Payer: 59

## 2018-12-22 DIAGNOSIS — Z23 Encounter for immunization: Secondary | ICD-10-CM

## 2019-02-12 ENCOUNTER — Other Ambulatory Visit: Payer: Self-pay | Admitting: Family Medicine

## 2019-02-15 NOTE — Telephone Encounter (Signed)
Lab Results  Component Value Date   CREATININE 1.33 03/27/2018   Lab Results  Component Value Date   K 3.9 03/27/2018

## 2019-03-04 DIAGNOSIS — G4733 Obstructive sleep apnea (adult) (pediatric): Secondary | ICD-10-CM | POA: Diagnosis not present

## 2019-03-12 DIAGNOSIS — N529 Male erectile dysfunction, unspecified: Secondary | ICD-10-CM | POA: Diagnosis not present

## 2019-03-12 DIAGNOSIS — E291 Testicular hypofunction: Secondary | ICD-10-CM | POA: Diagnosis not present

## 2019-03-12 DIAGNOSIS — N400 Enlarged prostate without lower urinary tract symptoms: Secondary | ICD-10-CM | POA: Diagnosis not present

## 2019-04-17 ENCOUNTER — Telehealth: Payer: Self-pay | Admitting: Family Medicine

## 2019-04-17 DIAGNOSIS — E785 Hyperlipidemia, unspecified: Secondary | ICD-10-CM

## 2019-04-17 NOTE — Telephone Encounter (Signed)
Please schedule patient for follow up in the next 30 days.  

## 2019-04-19 NOTE — Telephone Encounter (Signed)
Routine orders already placed by Dr. Sanda Klein- can switch them to my name

## 2019-04-19 NOTE — Telephone Encounter (Signed)
appt has been scheduled for later this month. He is asking that you please return call. He is wanting you to order any blood that is needed for this upcoming appt. He is suppose to be getting lab work for his urologist and is wanting to go ahead and get the lab work that is needed here too. He wants to get it done at the same time so he will not be going into a doctors office or lab several times. States he only want to do one draw.

## 2019-04-19 NOTE — Telephone Encounter (Signed)
Lvm to sch 30 day follow up

## 2019-04-20 NOTE — Telephone Encounter (Signed)
Patient no longer needs as he is transferring his primary care to another physician.

## 2019-05-14 ENCOUNTER — Ambulatory Visit: Payer: 59 | Admitting: Nurse Practitioner

## 2019-12-08 ENCOUNTER — Institutional Professional Consult (permissible substitution): Payer: 59 | Admitting: Pulmonary Disease

## 2019-12-14 ENCOUNTER — Ambulatory Visit (INDEPENDENT_AMBULATORY_CARE_PROVIDER_SITE_OTHER): Payer: 59 | Admitting: Pulmonary Disease

## 2019-12-14 ENCOUNTER — Other Ambulatory Visit: Payer: Self-pay

## 2019-12-14 ENCOUNTER — Encounter: Payer: Self-pay | Admitting: Pulmonary Disease

## 2019-12-14 VITALS — BP 142/70 | HR 70 | Temp 97.5°F | Ht 72.0 in | Wt 239.0 lb

## 2019-12-14 DIAGNOSIS — Z9989 Dependence on other enabling machines and devices: Secondary | ICD-10-CM

## 2019-12-14 DIAGNOSIS — G4719 Other hypersomnia: Secondary | ICD-10-CM | POA: Diagnosis not present

## 2019-12-14 DIAGNOSIS — G4733 Obstructive sleep apnea (adult) (pediatric): Secondary | ICD-10-CM | POA: Diagnosis not present

## 2019-12-14 MED ORDER — SUNOSI 75 MG PO TABS: 1.0000 | ORAL_TABLET | Freq: Every day | ORAL | 0 refills | Status: DC

## 2019-12-14 MED ORDER — SUNOSI 75 MG PO TABS: 75.0000 mg | ORAL_TABLET | Freq: Every day | ORAL | 3 refills | Status: DC

## 2019-12-14 NOTE — Patient Instructions (Signed)
Adequately treated obstructive sleep apnea with adequate number of hours of sleep with persistent daytime sleepiness  Stimulating medication is appropriate  I will give you a prescription for Sunosi  75 mg, can be increased to 150 if you feel it helps but not helping adequately  I will see you in 6 to 8 weeks  Call with any significant concerns  The medication can be taken as needed, you can stop it if you feel its not for you

## 2019-12-14 NOTE — Progress Notes (Signed)
Subjective:    Patient ID: Tom Wilkerson, male    DOB: 1965-12-30, 54 y.o.   MRN: [id number]  Patient with a history of obstructive sleep apnea diagnosed about 2 years ago  Has been using CPAP regularly  Initially felt much better, less daytime sleepiness Less fatigue during the day  Noticed some increase in his daytime sleepiness recently  Is compliant with CPAP use  Usually goes to bed between 10 and 11 Takes him about 5 to 30 minutes to fall asleep 3-4 awakenings Final wake up time about 7 AM  Weight is up about 10 to 15 pounds He stays quite active  Has a history of hypertension which is well controlled  No family history of obstructive sleep apnea No memory problems  Was following up with Dr. Ashby Dawes in [place]   Past Medical History:  Diagnosis Date  . Allergic rhinitis, cause unspecified   . Chest pain, unspecified   . Chicken pox   . Chronic prostatitis   . Elevated blood pressure reading without diagnosis of hypertension   . Esophageal reflux   . Insomnia, unspecified   . Irritability   . Nonspecific abnormal electrocardiogram (ECG) (EKG)   . Other alopecia   . Other and unspecified hyperlipidemia   . Other testicular hypofunction   . Sleep related leg cramps   . Unspecified contusion of eye   . Unspecified hearing loss   . Unspecified hyperplasia of prostate without urinary obstruction and other lower urinary tract symptoms (LUTS)     Review of Systems  Constitutional: Negative for fever and unexpected weight change.  HENT: Negative for congestion, dental problem, ear pain, nosebleeds, postnasal drip, rhinorrhea, sinus pressure, sneezing, sore throat and trouble swallowing.   Eyes: Negative for redness and itching.  Respiratory: Negative for cough, chest tightness, shortness of breath and wheezing.   Cardiovascular: Negative for palpitations and leg swelling.  Gastrointestinal: Negative for nausea and vomiting.  Genitourinary: Negative for  dysuria.  Musculoskeletal: Negative for joint swelling.  Skin: Negative for rash.  Allergic/Immunologic: Negative.  Negative for environmental allergies, food allergies and immunocompromised state.  Neurological: Negative for headaches.  Hematological: Does not bruise/bleed easily.  Psychiatric/Behavioral: Negative for dysphoric mood. The patient is nervous/anxious.       Objective:   Physical Exam Constitutional:      Appearance: He is obese.  HENT:     Head: Normocephalic and atraumatic.     Nose: Nose normal.     Mouth/Throat:     Mouth: Mucous membranes are moist.     Comments: Mallampati 3, crowded oropharynx Eyes:     Pupils: Pupils are equal, round, and reactive to light.  Cardiovascular:     Rate and Rhythm: Normal rate and regular rhythm.     Pulses: Normal pulses.     Heart sounds: Normal heart sounds. No murmur. No friction rub.  Pulmonary:     Effort: Pulmonary effort is normal. No respiratory distress.     Breath sounds: Normal breath sounds. No stridor. No wheezing or rhonchi.  Musculoskeletal:        General: Normal range of motion.     Cervical back: Normal range of motion and neck supple. No rigidity.  Skin:    General: Skin is warm.     Coloration: Skin is not jaundiced.  Neurological:     General: No focal deficit present.     Mental Status: He is alert.  Psychiatric:        Mood and  Affect: Mood normal.    Vitals:   12/14/19 1556  BP: (!) 142/70  Pulse: 70  Temp: (!) 97.5 F (36.4 C)  SpO2: 95%   Results of the Epworth flowsheet 12/14/2019 08/20/2017  Sitting and reading 1 1  Watching TV 2 1  Sitting, inactive in a public place (e.g. a theatre or a meeting) 1 1  As a passenger in a car for an hour without a break 2 2  Lying down to rest in the afternoon when circumstances permit 3 3  Sitting and talking to someone 0 0  Sitting quietly after a lunch without alcohol 1 1  In a car, while stopped for a few minutes in traffic 0 1  Total score 10  10   Compliance data reviewed showing 100% compliance Average use of 8 hours 44 minutes Machine is set between 5 and 20 95% median pressure of 10.2, maximum pressure of 11.7 Residual AHI of 0.9 No significant leaks    Assessment & Plan:  .  Adequately treated obstructive sleep apnea with residual excessive daytime sleepiness  . Patient will benefit from use of stimulants for excessive daytime sleepiness  .  Obesity  .  Hypertension-controlled  Plan: Trial with Sunosi 75 .  Dose may be increased to 150 if needed .  Encouraged to continue using CPAP on a regular basis .  Make sure he gets an adequate amount of sleep at night-he gets about 8 hours at present  .  We will follow-up in 6 to 8 weeks  .  Encouraged to call with any significant concerns

## 2019-12-14 NOTE — Addendum Note (Signed)
Addended by: Elton Sin on: 12/14/2019 05:11 PM   Modules accepted: Orders

## 2019-12-24 ENCOUNTER — Telehealth: Payer: Self-pay | Admitting: Pulmonary Disease

## 2019-12-24 NOTE — Telephone Encounter (Signed)
PA request received from [facility]  Drug requested: Sunosi 75mg  CMM Key: [id number] Tried/failed:  Covered alternatives:  PA request has been sent to plan, and a determination is expected within 1-3 days.   Routing to [place] for follow-up.

## 2019-12-24 NOTE — Telephone Encounter (Signed)
Medication name and strength: Sunosi 75mg  PA approved/denied: Denied If denied, reason for denial: This request was denied because you did not meet the following clinical requirements: The request for coverage for Sunosi 75mg , use as directed (30 per month), is denied. This decision is based on health plan criteria for Sunosi. This medicine is covered only if: (1) You have obstructive sleep apnea defined by one of the following: (A) Fifteen or more obstructive respiratory events per hour of sleep confirmed by a sleep study (unless the prescriber provides justification confirming that a sleep study would not be feasible). (B) Five or more obstructive respiratory events per hour of sleep confirmed by a sleep study (unless the prescriber provides justification confirming that a sleep study would not be feasible). (2) Both of the following: (A) Standard treatments for the underlying airway obstruction (for example: continuous positive airway pressure [CPAP], bi-level positive airway pressure [BiPAP]) have been used for one month or longer). (B) You are fully compliant with ongoing treatment(s) for the underlying airway obstruction. (3) You have tried or cannot use one of the following: (A) Armodafinil. (B) Modafinil. The information provided does not show that you meet the criteria listed above. The reason(s) [employer] did not approve this medication can be found above. This denial is based on our Sunosi drug coverage policy, in addition to any supplementary information you or your prescriber may have submitted.  [id number]  Based on patient's sleep study on 12/23/2016 he does qualify. Would you like to do an appeal?

## 2019-12-27 ENCOUNTER — Other Ambulatory Visit: Payer: Self-pay | Admitting: Pulmonary Disease

## 2019-12-27 MED ORDER — ARMODAFINIL 150 MG PO TABS: 150.0000 mg | ORAL_TABLET | Freq: Every day | ORAL | 0 refills | Status: DC

## 2019-12-27 NOTE — Telephone Encounter (Signed)
Prescription for armodafinil sent in

## 2019-12-27 NOTE — Progress Notes (Signed)
Prescription for Armodafinil sent in

## 2019-12-27 NOTE — Telephone Encounter (Signed)
Yes. Should be appealed.  Forward sleep study and compliance data

## 2020-01-12 NOTE — Telephone Encounter (Signed)
Noted  

## 2020-01-19 ENCOUNTER — Telehealth: Payer: Self-pay | Admitting: Family Medicine

## 2020-01-19 NOTE — Telephone Encounter (Signed)
I had a cancellation. Appointment scheduled for this Friday at 2:15pm

## 2020-01-19 NOTE — Telephone Encounter (Signed)
Patient's wife called stating that he has been having extreme neck pain. He was seen by [facility] but has not had any relief. They were referred to Dr Tamala Julian by two of his other patients. First available is not until March 19th and I also offered Dr Georgina Snell. They would only prefer to see Dr Tamala Julian. I have them on a cancellation list so that if anything opens up we can get him in to be seen. Is there anywhere that we might could work him in?

## 2020-01-21 ENCOUNTER — Encounter: Payer: Self-pay | Admitting: Family Medicine

## 2020-01-21 ENCOUNTER — Ambulatory Visit (INDEPENDENT_AMBULATORY_CARE_PROVIDER_SITE_OTHER): Payer: 59

## 2020-01-21 ENCOUNTER — Ambulatory Visit (INDEPENDENT_AMBULATORY_CARE_PROVIDER_SITE_OTHER): Payer: 59 | Admitting: Family Medicine

## 2020-01-21 ENCOUNTER — Other Ambulatory Visit: Payer: Self-pay

## 2020-01-21 VITALS — BP 150/86 | HR 79 | Ht 72.0 in | Wt 241.0 lb

## 2020-01-21 DIAGNOSIS — G8929 Other chronic pain: Secondary | ICD-10-CM

## 2020-01-21 DIAGNOSIS — M7581 Other shoulder lesions, right shoulder: Secondary | ICD-10-CM

## 2020-01-21 DIAGNOSIS — M19011 Primary osteoarthritis, right shoulder: Secondary | ICD-10-CM | POA: Diagnosis not present

## 2020-01-21 DIAGNOSIS — M19019 Primary osteoarthritis, unspecified shoulder: Secondary | ICD-10-CM | POA: Insufficient documentation

## 2020-01-21 DIAGNOSIS — M25511 Pain in right shoulder: Secondary | ICD-10-CM | POA: Diagnosis not present

## 2020-01-21 MED ORDER — GABAPENTIN 100 MG PO CAPS: 200.0000 mg | ORAL_CAPSULE | Freq: Every day | ORAL | 3 refills | Status: AC

## 2020-01-21 NOTE — Assessment & Plan Note (Signed)
Injection given today. Tolerated the procedure well. Does have some underlying arthritis that could get some exacerbation from time to time. Patient is in agreement with the plan. Avoid activities were hands are outside of peripheral vision. Differential includes cervical radiculopathy and we will see how patient responds to the injections today.

## 2020-01-21 NOTE — Progress Notes (Signed)
[facility] [address] [place] Phone: [phone] Subjective:   I Kandace Blitz am serving as a Education administrator for Dr. Hulan Saas.  This visit occurred during the SARS-CoV-2 public health emergency.  Safety protocols were in place, including screening questions prior to the visit, additional usage of staff PPE, and extensive cleaning of exam room while observing appropriate contact time as indicated for disinfecting solutions.   I'm seeing this patient by the request  of:  [name], MD  CC: Right-sided shoulder pain  [id number]  MARQUIN HAMMER is a 54 y.o. male coming in with complaint of neck pain. Pain is in his shoulder. MRI of shoulder didn't show anything. Decreased ROM or right arm. Tried MRI of neck at [facility] but it was too painful. Constant pain. Some numbness and tingling in the finger tips. Pain wakes him up in the middle of the night. Can't take NSAIDs due to face swelling. Patient is hard at hearing. 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 Right arm is weak. Shoulder has a loss of ROM.   Onset- Chronic (Feburary 19th) Location - right sided arm pain, bicep and tennis elbow Duration-  Character- sore, throb, can be sharp and stabbing  Aggravating factors- sleeping, extension  Reliving factors-  Therapies tried- muscle relaxer, prednisone didn't help kept him up at night, shoulder injection, ice daily  Severity- 7/10 at its worse    Patient did have x-rays done in 2013 that were independently visualized by me showing the patient did have the lower cervical spine degenerative disc disease mostly at C6-7 moderate in severity .  Care everywhere was able to get report of most recent cervical x-rays from February 23.  Does show slight progression from 2013.  Patient has been seen by a orthopedist and recently had MRI. MRI of the right shoulder show the patient has severe tendinosis of  the supraspinatus and acromioclavicular arthritis. Patient did have x-rays of the neck once again that did not show any significant worsening in severity of the neck. Patient was told that he needed an MRI of the cervical spine but having difficulty staying without moving. Is going to have a repeating one done now scheduled for Wednesday. L for second opinion. Past Medical History:  Diagnosis Date  . Allergic rhinitis, cause unspecified   . Chest pain, unspecified   . Chicken pox   . Chronic prostatitis   . Elevated blood pressure reading without diagnosis of hypertension   . Esophageal reflux   . Insomnia, unspecified   . Irritability   . Nonspecific abnormal electrocardiogram (ECG) (EKG)   . Other alopecia   . Other and unspecified hyperlipidemia   . Other testicular hypofunction   . Sleep related leg cramps   . Unspecified contusion of eye   . Unspecified hearing loss   . Unspecified hyperplasia of prostate without urinary obstruction and other lower urinary tract symptoms (LUTS)    Past Surgical History:  Procedure Laterality Date  . DENTAL RESTORATION/EXTRACTION WITH X-RAY    . TONSILLECTOMY  01/31/2009   [facility]son Landry   Social History   Socioeconomic History  . Marital status: Married    Spouse name: Not on file  . Number of children: Not on file  . Years of education: Not on file  . Highest education level: Not on file  Occupational History  . 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 Smoking status: Never Smoker  . Smokeless tobacco: Never Used  Substance and Sexual Activity  . Alcohol use: Yes    Comment: occasional socially twice weekly beer  . Drug use: No  . Sexual activity: Yes  Other Topics Concern  . Not on file  Social History Narrative   Always uses seat belts. Smoke alarm and carbon monoxide detector in the home.Guns in the home stored in locked cabinet. Caffeine use Large more than 8  servings per day. Married x 23 years, happily married,no children.Nutrtion Poorly balanced diet. Exercise: moderate 3 x week, walking,cycling. Pets: cat, dog   Social Determinants of Health   Financial Resource Strain:   . Difficulty of Paying Living Expenses: Not on file  Food Insecurity:   . Worried About Charity fundraiser in the Last Year: Not on file  . Ran Out of Food in the Last Year: Not on file  Transportation Needs:   . Lack of Transportation (Medical): Not on file  . Lack of Transportation (Non-Medical): Not on file  Physical Activity:   . Days of Exercise per Week: Not on file  . Minutes of Exercise per Session: Not on file  Stress:   . Feeling of Stress : Not on file  Social Connections:   . Frequency of Communication with Friends and Family: Not on file  . Frequency of Social Gatherings with Friends and Family: Not on file  . Attends Religious Services: Not on file  . Active Member of Clubs or Organizations: Not on file  . Attends Archivist Meetings: Not on file  . Marital Status: Not on file   Allergies  Allergen Reactions  . Bee Venom     Bee Stings   . Codeine     Insomnia and Hyperactivity  . Ibuprofen Itching and Swelling  . Nsaids Other (See Comments)  . Amlodipine Other (See Comments)    dizziness   Family History  Problem Relation Age of Onset  . Hypertension Mother   . Lung cancer Mother   . Depression Mother   . Hyperlipidemia Mother   . Arthritis Mother   . Kidney cancer Mother   . Cancer Mother        kidney, lung  . CAD Father        CABG age 73  . Hypertension Father   . Stroke Father   . Hyperlipidemia Father   . Prostate cancer Father   . Congestive Heart Failure Father   . Cancer Father        prostate  . Cancer Unknown        malignancy prostate  . Migraines Sister   . Liver disease Brother        on liver transplant list  . Ulcerative colitis Brother   . COPD Neg Hx   . Diabetes Neg Hx   . Heart disease Neg  Hx     Current Outpatient Medications (Endocrine & Metabolic):  .  testosterone cypionate (DEPOTESTOTERONE CYPIONATE) 100 MG/ML injection, Inject into the muscle every 7 (seven) days. For IM use only   Current Outpatient Medications (Cardiovascular):  .  losartan (COZAAR) 25 MG tablet, Take 25 mg by mouth daily. .  pravastatin (PRAVACHOL) 20 MG tablet, TAKE 1 TABLET EVERY OTHER DAY AT BEDTIME   Current Outpatient Medications (Analgesics):  .  aspirin EC 81 MG tablet, Take 81 mg by mouth daily.   Current Outpatient  Medications (Other):  Marland Kitchen  AMBULATORY NON FORMULARY MEDICATION, Patient has OSA or probable OSA (Yes or No): yes Is the patient currently using CPAP within the home? (Yes or No): yes If yes to question 2, what is the current DME provider?  Are there any changes to the order/settings? (if yes, please specify) no If no to question 2, date of sleep study: 10/22/17 Date of face-to-face encounter: 01/13/2018 Settings: auto 5-20 cm h2O Signs and symptoms or probable OSA, mention all that apply (snoring) (morning  Headaches) (witnessed apneas) (choking) (gasping during sleep): snoring, apnea Resmed S/O air/auto with heated humidity.   Enroll in [place] [facility] [employer] CPAP supplies needed: mask of choice, headgear, cushions, filters, climate control tubing and water chamber. .  Armodafinil 150 MG tablet, Take 1 tablet (150 mg total) by mouth daily. Marland Kitchen  buPROPion (WELLBUTRIN XL) 150 MG 24 hr tablet, TAKE ONE TABLET BY MOUTH EVERY DAY .  Coenzyme Q10 (COQ10) 50 MG CAPS, Take 1 capsule by mouth daily. .  cyclobenzaprine (FLEXERIL) 5 MG tablet, Take 1-2 tablets (5-10 mg total) by mouth every 8 (eight) hours as needed for muscle spasms. Marland Kitchen  LORazepam (ATIVAN) 0.5 MG tablet, One by mouth every six hours if needed for stormy days or sleep .  nystatin (MYCOSTATIN/NYSTOP) powder, Apply 100,000 g topically as needed. .  Probiotic Product (PROBIOTIC PO), Take by mouth daily. .  Solriamfetol HCl  (SUNOSI) 75 MG TABS, Take 75 mg by mouth daily. .  Solriamfetol HCl (SUNOSI) 75 MG TABS, Take 1 tablet by mouth daily. Marland Kitchen  gabapentin (NEURONTIN) 100 MG capsule, Take 2 capsules (200 mg total) by mouth at bedtime.   Reviewed prior external information including notes and imaging from  primary care provider As well as notes that were available from care everywhere and other healthcare systems.  Past medical history, social, surgical and family history all reviewed in electronic medical record.  No pertanent information unless stated regarding to the chief complaint.   Review of Systems:  No headache, visual changes, nausea, vomiting, diarrhea, constipation, dizziness, abdominal pain, skin rash, fevers, chills, night sweats, weight loss, swollen lymph nodes, body aches, joint swelling, chest pain, shortness of breath, mood changes. POSITIVE muscle aches  Objective  Blood pressure (!) 150/86, pulse 79, height 6' (1.829 m), weight 241 lb (109.3 kg), SpO2 98 %.   General: Appears very uncomfortable HEENT: Pupils equal, extraocular movements intact  Respiratory: Patient's speak in full sentences and does not appear short of breath  Cardiovascular: No lower extremity edema, non tender, no erythema  Skin: Warm dry intact with no signs of infection or rash on extremities or on axial skeleton.  Abdomen: Soft nontender  Neuro: Cranial nerves II through XII are intact, neurovascularly intact in all extremities with 2+ DTRs and 2+ pulses.  Lymph: No lymphadenopathy of posterior or anterior cervical chain or axillae bilaterally.  Gait normal with good balance and coordination.  MSK:  Non tender with full range of motion and good stability and symmetric strength and tone of , elbows, wrist, hip, knee and ankles bilaterally.  Right shoulder exam shows the patient has voluntary guarding noted. Positive impingement noted. 4+ out of 5 strength of the rotator cuff. Positive crossover test noted. Tenderness  over the acromioclavicular joint. Neurovascularly intact distally.  Neck exam does have some loss of lordosis. No severe tenderness noted with extension of the neck. No true radicular symptoms though.  Limited musculoskeletal ultrasound was performed and interpreted .me  Limited ultrasound of  patient supraspinatus shows severe hypoechoic changes but no true tear appreciated consistent with tendinosis. Patient also has moderate acromioclavicular arthritis noted. Impingement testing shows some mild subacromial bursitis  Procedure: Real-time Ultrasound Guided Injection of right glenohumeral joint Device: GE Logiq Q7  Ultrasound guided injection is preferred based studies that show increased duration, increased effect, greater accuracy, decreased procedural pain, increased response rate with ultrasound guided versus blind injection.  Verbal informed consent obtained.  Time-out conducted.  Noted no overlying erythema, induration, or other signs of local infection.  Skin prepped in a sterile fashion.  Local anesthesia: Topical Ethyl chloride.  With sterile technique and under real time ultrasound guidance:  Joint visualized.  23g 1  inch needle inserted posterior approach. Pictures taken for needle placement. Patient did have injection of 2 cc of 1% lidocaine, 2 cc of 0.5% Marcaine, and 1.0 cc of Kenalog 40 mg/dL. Completed without difficulty  Pain immediately resolved suggesting accurate placement of the medication.  Advised to call if fevers/chills, erythema, induration, drainage, or persistent bleeding.  Images permanently stored and available for review in the ultrasound unit.  Impression: Technically successful ultrasound guided injection.  Procedure: Real-time Ultrasound Guided Injection of right acromioclavicular joint Device: GE Logiq Q7 Ultrasound guided injection is preferred based studies that show increased duration, increased effect, greater accuracy, decreased procedural pain,  increased response rate, and decreased cost with ultrasound guided versus blind injection.  Verbal informed consent obtained.  Time-out conducted.  Noted no overlying erythema, induration, or other signs of local infection.  Skin prepped in a sterile fashion.  Local anesthesia: Topical Ethyl chloride.  With sterile technique and under real time ultrasound guidance: With a 25-gauge half inch needle injected with 0.5 cc of 0.5% Marcaine and 0.5 cc of Kenalog 40 mg/mL Completed without difficulty  Pain immediately resolved suggesting accurate placement of the medication.  Advised to call if fevers/chills, erythema, induration, drainage, or persistent bleeding.  Images permanently stored and available for review in the ultrasound unit.  Impression: Technically successful ultrasound guided injection.  [id number]; 15 additional minutes spent for Therapeutic exercises as stated in above notes.  This included exercises focusing on stretching, strengthening, with significant focus on eccentric aspects.   Long term goals include an improvement in range of motion, strength, endurance as well as avoiding reinjury. Patient's frequency would include in 1-2 times a day, 3-5 times a week for a duration of 6-12 weeks.   Shoulder Exercises that included:  Basic scapular stabilization to include adduction and depression of scapula Scaption, focusing on proper movement and good control Internal and External rotation utilizing a theraband, with elbow tucked at side entire time Rows with theraband  Proper technique shown and discussed handout in great detail with ATC.  All questions were discussed and answered.     Impression and Recommendations:     This case required medical decision making of moderate complexity. The above documentation has been reviewed and is accurate and complete Lyndal Pulley, DO       Note: This dictation was prepared with Dragon dictation along with smaller phrase technology. Any  transcriptional errors that result from this process are unintentional.

## 2020-01-21 NOTE — Patient Instructions (Signed)
Good to see you.  Ice 20 minutes 2 times daily. Usually after activity and before bed. Exercises 3 times a week.  Gabapentin 200 mg at night  Turmeric 500mg  daily  Tart cherry extract 1200mg  at night Vitamin D 2000 IU daily  Send me a message on Monday to see if you need a follow up

## 2020-01-21 NOTE — Assessment & Plan Note (Signed)
Injection given today. Tolerated the procedure well. Ultrasound guidance confirmed placement. No significant provement but does make [place] feel more likely that patient does need to be further work-up for cervical radiculopathy. Gabapentin given for nighttime relief. Work with Product/process development scientist to learn home exercises in greater detail. Follow-up again in 4 to 6 weeks

## 2020-01-24 ENCOUNTER — Encounter: Payer: Self-pay | Admitting: Family Medicine

## 2020-01-28 ENCOUNTER — Telehealth: Payer: Self-pay

## 2020-01-28 NOTE — Telephone Encounter (Signed)
Patient's wife called in regard to patients visit with [facility] Dr. Lynford Humphrey who was not very pleasant "rude" and they were given contradictory information about the steroid injections and COVID vaccine.   01/07/2020- was given a Prednisone dose pack  01/17/2020- steroid shoulder injection  01/21/2020 2 injections in shoulder from Dr. Tamala Julian 01/28/2020 saw Dr. Lynford Humphrey 02/10/2020 is supposed to get epidural 02/17/2020- scheduled for second vaccine  Patient was told to not get the vaccine till about 02/24/20 that he should of never been given any injection if getting the vaccine or visa versa. Patients wife talk to the pharmacy and the pharmacy tech said he should not get the vaccine either. Patient and wife trust Dr. Thompson Caul word but just wanted to make doubly sure that it would be okay for patient to get the second vaccine and if not what should they do?  Would really appreciate a call back   I informed Wife that I was told by Dr. Tamala Julian before that if it is not an IM injection than it is okay to get the vaccine. Since patient was getting joint injections he should be fine to continue with second vaccine I also asked Dr. Georgina Snell if the epidural would change anything and he stated that patient wold be fine to get the epidural on the 25th then the vaccine on the first. I did say if they wanted to push and get the vaccine they could do 24 days after but the vaccine is supposed to be given in 21 days or 3 days before or after that 21 days to be most effective. Patients wife stated understanding but would still like to hear what Dr. Tamala Julian has to say. Patient's wife appreciates the time and assistance with this matter.

## 2020-01-29 NOTE — Telephone Encounter (Signed)
Definitely feel good about getting the vaccine.  Could hold on the epidural if you want to be totally sure until at least 8 days after second injection but feel confident should be fine even with getting it the week before   The 2nd vaccine is very important to get. I am sorry but out of office hope this helps.

## 2020-01-31 NOTE — Telephone Encounter (Signed)
Spoke with patient. He is going to hold off on getting epidural at this time as he is also starting to feel better.

## 2020-02-21 ENCOUNTER — Encounter: Payer: Self-pay | Admitting: Anesthesiology

## 2020-02-21 DIAGNOSIS — M5030 Other cervical disc degeneration, unspecified cervical region: Secondary | ICD-10-CM | POA: Insufficient documentation

## 2020-02-21 DIAGNOSIS — M542 Cervicalgia: Secondary | ICD-10-CM | POA: Insufficient documentation

## 2020-02-21 NOTE — Progress Notes (Signed)
Virtual Visit via Telephone Note  I connected with Tom Wilkerson on 02/21/20 at  1:00 PM EDT by telephone and verified that I am speaking with the correct person using two identifiers.  Location: Patient: Home Provider: Pain control center   I discussed the limitations, risks, security and privacy concerns of performing an evaluation and management service by telephone and the availability of in person appointments. I also discussed with the patient that there may be a patient responsible charge related to this service. The patient expressed understanding and agreed to proceed.   History of Present Illness: I spoke with Tom Wilkerson via telephone today.  He was unable to do the video portion of the virtual conference.  He is a new patient with a history of neck pain.  He also is reporting right upper extremity weakness affecting the hand with perpetual pain.  He has been seen by Dr. Earle Gell and has had a recent cervical MRI showing multilevel severe degenerative disc disease and foraminal stenosis.  He has contemplated a 4 level cervical fusion however did speak with Dr. Arnoldo Morale about a possible epidural steroid in advance of that procedure and is desiring to proceed with that if possible.  He is describing a longstanding aching gnawing severe pain that can be incapacitating.  It affects the base of his neck with radiation into the right shoulder and down the arm with associated numbness and tingling affecting the right thumb with intermittent weakness in the right hand and arm.  The pain can be incapacitating.  He is also had a shoulder injection under ultrasound that gave him some mild relief of some right shoulder pain but this did not take away the right neck and arm symptoms.  He has been through physical therapy with limited success and has been on medication management with limited success.   Cervical MRI: She has evidence of C3-4 and C5-6 spondylotic disc protrusions with prominent right  uncovertebral joint hypertrophic changes and severe right foraminal stenosis and effacement of the right existing nerve root and at C6-C7 there is a broad-based disc protrusion with rightward and also eccentric in the left foraminal location with severe by foraminal stenosis lastly at C4-5 there is a broad-based disc protrusion with ventral thecal sac effacement and moderate to severe by foraminal stenosis   Observations/Objective:  Current Outpatient Medications:  .  AMBULATORY NON FORMULARY MEDICATION, Patient has OSA or probable OSA (Yes or No): yes Is the patient currently using CPAP within the home? (Yes or No): yes If yes to question 2, what is the current DME provider?  Are there any changes to the order/settings? (if yes, please specify) no If no to question 2, date of sleep study: 10/22/17 Date of face-to-face encounter: 01/13/2018 Settings: auto 5-20 cm h2O Signs and symptoms or probable OSA, mention all that apply (snoring) (morning  Headaches) (witnessed apneas) (choking) (gasping during sleep): snoring, apnea Resmed S/O air/auto with heated humidity.   Enroll in [place] [facility] [employer] CPAP supplies needed: mask of choice, headgear, cushions, filters, climate control tubing and water chamber., Disp: 1 each, Rfl: 0 .  Armodafinil 150 MG tablet, Take 1 tablet (150 mg total) by mouth daily., Disp: 30 tablet, Rfl: 0 .  aspirin EC 81 MG tablet, Take 81 mg by mouth daily., Disp: , Rfl:  .  buPROPion (WELLBUTRIN XL) 150 MG 24 hr tablet, TAKE ONE TABLET BY MOUTH EVERY DAY, Disp: 90 tablet, Rfl: 3 .  Coenzyme Q10 (COQ10) 50 MG CAPS,  Take 1 capsule by mouth daily., Disp: , Rfl:  .  cyclobenzaprine (FLEXERIL) 5 MG tablet, Take 1-2 tablets (5-10 mg total) by mouth every 8 (eight) hours as needed for muscle spasms., Disp: 50 tablet, Rfl: 3 .  gabapentin (NEURONTIN) 100 MG capsule, Take 2 capsules (200 mg total) by mouth at bedtime., Disp: 180 capsule, Rfl: 3 .  LORazepam (ATIVAN) 0.5 MG tablet,  One by mouth every six hours if needed for stormy days or sleep, Disp: 15 tablet, Rfl: 0 .  losartan (COZAAR) 25 MG tablet, Take 25 mg by mouth daily., Disp: , Rfl:  .  nystatin (MYCOSTATIN/NYSTOP) powder, Apply 100,000 g topically as needed., Disp: , Rfl:  .  pravastatin (PRAVACHOL) 20 MG tablet, TAKE 1 TABLET EVERY OTHER DAY AT BEDTIME, Disp: 45 tablet, Rfl: 0 .  Probiotic Product (PROBIOTIC PO), Take by mouth daily., Disp: , Rfl:  .  Solriamfetol HCl (SUNOSI) 75 MG TABS, Take 75 mg by mouth daily., Disp: 30 tablet, Rfl: 3 .  Solriamfetol HCl (SUNOSI) 75 MG TABS, Take 1 tablet by mouth daily., Disp: 7 tablet, Rfl: 0 .  testosterone cypionate (DEPOTESTOTERONE CYPIONATE) 100 MG/ML injection, Inject into the muscle every 7 (seven) days. For IM use only , Disp: , Rfl:   Assessment and Plan: 1. Cervical radiculitis   2. Cervicalgia   3. DDD (degenerative disc disease), cervical   4. Muscle weakness of right arm   Based on our discussion today I think he would be a candidate for a cervical epidural steroid injection.  He is hoping to avoid surgery if possible and I have had an earnest discussion about the risks and benefits of the procedure and he is aware that the injection may be of no benefit and or temporizing and that it may allow him to delay ultimate surgery or avoid it completely.  I will schedule him for his first in person clinic appointment in the next 2 weeks as he has had a recent Covid vaccination approximately 4 to 5 days ago.  Because of the steroid issue he would like to give that 2 weeks which is reasonable.  I have gone over the risks and benefits of the cervical epidural with him in detail and I think he is familiar and comfortable with his decision to proceed with the procedure.  We will schedule this as mentioned.  I have also informed him that if the right arm weakness persists that this is a indication for surgical decompression and needs to be evaluated with Dr. Arnoldo Morale or a  neurosurgeon.  Follow Up Instructions:    I discussed the assessment and treatment plan with the patient. The patient was provided an opportunity to ask questions and all were answered. The patient agreed with the plan and demonstrated an understanding of the instructions.   The patient was advised to call back or seek an in-person evaluation if the symptoms worsen or if the condition fails to improve as anticipated.  I provided 40 minutes of non-face-to-face time during this encounter.   Molli Barrows, MD

## 2020-02-22 ENCOUNTER — Encounter: Payer: Self-pay | Admitting: Anesthesiology

## 2020-02-22 ENCOUNTER — Ambulatory Visit: Payer: 59 | Attending: Anesthesiology | Admitting: Anesthesiology

## 2020-02-22 DIAGNOSIS — M6281 Muscle weakness (generalized): Secondary | ICD-10-CM

## 2020-02-22 DIAGNOSIS — M5030 Other cervical disc degeneration, unspecified cervical region: Secondary | ICD-10-CM | POA: Diagnosis not present

## 2020-02-22 DIAGNOSIS — M5412 Radiculopathy, cervical region: Secondary | ICD-10-CM | POA: Diagnosis not present

## 2020-02-22 DIAGNOSIS — M542 Cervicalgia: Secondary | ICD-10-CM | POA: Diagnosis not present

## 2020-02-29 ENCOUNTER — Other Ambulatory Visit: Payer: Self-pay | Admitting: Neurosurgery

## 2020-03-03 ENCOUNTER — Other Ambulatory Visit: Payer: Self-pay | Admitting: Neurosurgery

## 2020-03-06 ENCOUNTER — Encounter (HOSPITAL_COMMUNITY): Payer: Self-pay

## 2020-03-06 ENCOUNTER — Ambulatory Visit: Payer: 59 | Admitting: Anesthesiology

## 2020-03-06 NOTE — Progress Notes (Signed)
[facility] - [place], [place] - [address] [place] [address] Phone: [phone] Fax: [phone]    Your procedure is scheduled on Thursday, April 22nd.  Report to [facility] Main Entrance "A" at 5:30 A.M., and check in at the Admitting office.  Call this number if you have problems the morning of surgery:  [phone]  Call [phone] if you have any questions prior to your surgery date Monday-Friday 8am-4pm   Remember:  Do not eat or drink after midnight the night before your surgery    Take these medicines the morning of surgery with A SIP OF WATER buPROPion (WELLBUTRIN XL)   gabapentin (NEURONTIN)  tamsulosin (FLOMAX)  If needed - acetaminophen (TYLENOL), ARTIFICIAL TEAR OINTMENT/eye drops, cyclobenzaprine (FLEXERIL), LORazepam (ATIVAN), Sodium Chloride-Xylitol (XLEAR SINUS CARE SPRAY NA)/nasal spray   As of today, STOP taking any Aspirin (unless otherwise instructed by your surgeon) and Aspirin containing products, Aleve, Naproxen, Ibuprofen, Motrin, Advil, Goody's, BC's, all herbal medications, fish oil, and all vitamins.             Do not wear jewelry.            Do not wear lotions, powders, colognes, or deodorant.            Men may shave face and neck.            Do not bring valuables to the hospital.            [facility] is not responsible for any belongings or valuables.  Do NOT Smoke (Tobacco/Vapping) or drink Alcohol 24 hours prior to your procedure If you use a CPAP at night, you may bring all equipment for your overnight stay.   Contacts, glasses, dentures or bridgework may not be worn into surgery.      For patients admitted to the hospital, discharge time will be determined by your treatment team.   Patients discharged the day of surgery will not be allowed to drive home, and someone needs to stay with them for 24 hours.  Special instructions:   [facility]- Preparing For Surgery  Before surgery, you can play an  important role. Because skin is not sterile, your skin needs to be as free of germs as possible. You can reduce the number of germs on your skin by washing with CHG (chlorahexidine gluconate) Soap before surgery.  CHG is an antiseptic cleaner which kills germs and bonds with the skin to continue killing germs even after washing.    Oral Hygiene is also important to reduce your risk of infection.  Remember - BRUSH YOUR TEETH THE MORNING OF SURGERY WITH YOUR REGULAR TOOTHPASTE  Please do not use if you have an allergy to CHG or antibacterial soaps. If your skin becomes reddened/irritated stop using the CHG.  Do not shave (including legs and underarms) for at least 48 hours prior to first CHG shower. It is OK to shave your face.  Please follow these instructions carefully.   1. Shower the NIGHT BEFORE SURGERY and the MORNING OF SURGERY with CHG Soap.   2. If you chose to wash your hair, wash your hair first as usual with your normal shampoo.  3. After you shampoo, rinse your hair and body thoroughly to remove the shampoo.  4. Use CHG as you would any other liquid soap. You can apply CHG directly to the skin and wash gently with a scrungie or a clean washcloth.   5. Apply the  CHG Soap to your body ONLY FROM THE NECK DOWN.  Do not use on open wounds or open sores. Avoid contact with your eyes, ears, mouth and genitals (private parts). Wash Face and genitals (private parts)  with your normal soap.   6. Wash thoroughly, paying special attention to the area where your surgery will be performed.  7. Thoroughly rinse your body with warm water from the neck down.  8. DO NOT shower/wash with your normal soap after using and rinsing off the CHG Soap.  9. Pat yourself dry with a CLEAN TOWEL.  10. Wear CLEAN PAJAMAS to bed the night before surgery, wear comfortable clothes the morning of surgery  11. Place CLEAN SHEETS on your bed the night of your first shower and DO NOT SLEEP WITH PETS.  Day of  Surgery:  Do not apply any deodorants/lotions.  Please wear clean clothes to the hospital/surgery center.   Remember to brush your teeth WITH YOUR REGULAR TOOTHPASTE.   Please read over the following fact sheets that you were given.

## 2020-03-07 ENCOUNTER — Encounter (HOSPITAL_COMMUNITY)
Admission: RE | Admit: 2020-03-07 | Discharge: 2020-03-07 | Disposition: A | Discharge status: Home / Self Care | Payer: 59 | Source: Ambulatory Visit | Attending: Neurosurgery | Admitting: Neurosurgery

## 2020-03-07 ENCOUNTER — Encounter (HOSPITAL_COMMUNITY): Payer: Self-pay

## 2020-03-07 ENCOUNTER — Other Ambulatory Visit (HOSPITAL_COMMUNITY)
Admission: RE | Admit: 2020-03-07 | Discharge: 2020-03-07 | Disposition: A | Discharge status: Home / Self Care | Payer: 59 | Source: Ambulatory Visit | Attending: Neurosurgery | Admitting: Neurosurgery

## 2020-03-07 ENCOUNTER — Other Ambulatory Visit: Payer: Self-pay

## 2020-03-07 DIAGNOSIS — Z01812 Encounter for preprocedural laboratory examination: Secondary | ICD-10-CM | POA: Insufficient documentation

## 2020-03-07 HISTORY — DX: Essential (primary) hypertension: I10

## 2020-03-07 HISTORY — DX: Primary osteoarthritis, unspecified shoulder: M19.019

## 2020-03-07 HISTORY — DX: Sleep apnea, unspecified: G47.30

## 2020-03-07 HISTORY — DX: Spondylosis without myelopathy or radiculopathy, cervical region: M47.812

## 2020-03-07 HISTORY — DX: Enterocolitis due to Clostridium difficile, not specified as recurrent: A04.72

## 2020-03-07 LAB — SURGICAL PCR SCREEN
MRSA, PCR: NEGATIVE
Staphylococcus aureus: NEGATIVE

## 2020-03-07 LAB — TYPE AND SCREEN
ABO/RH(D): O POS
Antibody Screen: NEGATIVE

## 2020-03-07 LAB — CBC
HCT: 49.3 % (ref 39.0–52.0)
Hemoglobin: 15.5 g/dL (ref 13.0–17.0)
MCH: 25.8 pg — ABNORMAL LOW (ref 26.0–34.0)
MCHC: 31.4 g/dL (ref 30.0–36.0)
MCV: 82.2 fL (ref 80.0–100.0)
Platelets: 257 10*3/uL (ref 150–400)
RBC: 6 MIL/uL — ABNORMAL HIGH (ref 4.22–5.81)
RDW: 17.6 % — ABNORMAL HIGH (ref 11.5–15.5)
WBC: 6.7 10*3/uL (ref 4.0–10.5)
nRBC: 0 % (ref 0.0–0.2)

## 2020-03-07 LAB — BASIC METABOLIC PANEL
Anion gap: 9 (ref 5–15)
BUN: 14 mg/dL (ref 6–20)
CO2: 30 mmol/L (ref 22–32)
Calcium: 9.5 mg/dL (ref 8.9–10.3)
Chloride: 102 mmol/L (ref 98–111)
Creatinine, Ser: 1.3 mg/dL — ABNORMAL HIGH (ref 0.61–1.24)
GFR calc Af Amer: >60 mL/min (ref >60)
GFR calc non Af Amer: >60 mL/min (ref >60)
Glucose, Bld: 81 mg/dL (ref 70–99)
Potassium: 3.8 mmol/L (ref 3.5–5.1)
Sodium: 141 mmol/L (ref 135–145)

## 2020-03-07 LAB — SARS CORONAVIRUS 2 (TAT 6-24 HRS): SARS Coronavirus 2: NEGATIVE

## 2020-03-07 LAB — ABO/RH: ABO/RH(D): O POS

## 2020-03-07 NOTE — Progress Notes (Signed)
PCP - Dr. Reginia Forts Cardiologist - denies  PPM/ICD - denies  Chest x-ray - N/A EKG - 01/11/2020 - (C.E) - tracing placed in chart Stress Test - 2013 ECHO - denies Cardiac Cath - denies  Sleep Study/CPAP - "about 3-4 years ago and wears it every night"  Blood Thinner Instructions: N/A Aspirin Instructions: N/A  ERAS Protcol - No  COVID TEST- Scheduled for today 03/07/2020 after PAT appointment. Patient verbalized understanding of self-quarantine instructions, appointment time and place.  Anesthesia review: YES, GI hx  Patient denies shortness of breath, fever, cough and chest pain at PAT appointment  All instructions explained to the patient, with a verbal understanding of the material. Patient agrees to go over the instructions while at home for a better understanding. Patient also instructed to self quarantine after being tested for COVID-19. The opportunity to ask questions was provided.

## 2020-03-08 ENCOUNTER — Encounter (HOSPITAL_COMMUNITY): Payer: Self-pay

## 2020-03-08 ENCOUNTER — Encounter (HOSPITAL_COMMUNITY): Payer: Self-pay | Admitting: Neurosurgery

## 2020-03-08 NOTE — Progress Notes (Signed)
Anesthesia Chart Review:  Case: [id number] Date/Time: 03/09/20 0715   Procedure: ANTERIOR CERVICAL DECOMPRESSION/DISCECTOMY FUSION, INTERBODY PROSTHESIS, PLATE/SCREWS, C3-4, C4-5, C5-6, C6-7 (N/A ) - 3C   Anesthesia type: General   Pre-op diagnosis: CERVICAL SPONDYLOSIS WITH RADICULOPATHY   Location: [facility] OR ROOM 20 / [facility]   Surgeons: Newman Pies, MD      DISCUSSION: Tom Wilkerson is a 54 year old male scheduled for the above procedure.  History includes never smoker, HTN, OSA (CPAP), HLD, BPH with chronic prostatitis, reflux, hearing loss, recurrent C. 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 BMI is consistent with obesity.   Also with history of chest pain with non-ischemic, EF 47% stress test in 2013, also saw cardiologist Dr. Meda Coffee in 2014 for atypical chest pain and his PCP [name], MD on 01/11/20. In review of 01/11/20 note in [facility], radiating right sided chest pain seemed atypical for cardiac etiology but referred to cardiology due to cardiac risk factors including family history of early CAD. His EKG was normal. He denied chest pain at PAT RN visit. Reportedly, he never saw cardiology after February referral since c-spine MRI showed cervical disc protrusions and foraminal stenoses and thought to be the cause of his pain. Given recent cardiology referral, further clarification needed regarding preoperative recommendations. Nikki from Dr. Arnoldo Morale' reached out to [name], MD. Notes in [facility] suggest that she contacted the Tom Wilkerson to further discuss clearance status. She subsequently cleared Tom Wilkerson for cervical spine surgery at "low risk" and noted "*No indication for cardiology clearance prior to surgery."  I have reviewed with anesthesiologist Lillia Abed, MD. Anesthesia team to evaluate on the day of surgery. 03/07/20 COVID-19 test negative. His wife wanted to make sure staff were aware of  C. Difficile history following Cefdinir. She has also notified Dr. Arnoldo Morale staff since he will be prescribing any perioperative antibiotics.    VS: BP 134/79   Pulse 82   Temp 36.9 C (Oral)   Resp 18   Ht 6' (1.829 m)   Wt 111.2 kg   SpO2 100%   BMI 33.26 kg/m   PROVIDERS: [name], MD is PCP. Last visit 02/16/20 ([facility])  - He is not routinely followed by cardiology, but last evaluated in 2014 by Ena Dawley, MD for atypical chest pain. Cardiac CT ordered, but canceled after Creatinine elevated, so nuclear stress test considered--although last stress test noted was from 07/2012.  Gaylyn Cheers, MD is GI. Last seen ~ 2018.  - Urologist is with [facility].   LABS: Labs reviewed: Acceptable for surgery. (all labs ordered are listed, but only abnormal results are displayed)  Labs Reviewed  CBC - Abnormal; Notable for the following components:      Result Value   RBC 6.00 (*)    MCH 25.8 (*)    RDW 17.6 (*)    All other components within normal limits  BASIC METABOLIC PANEL - Abnormal; Notable for the following components:   Creatinine, Ser 1.30 (*)    All other components within normal limits  SURGICAL PCR SCREEN  TYPE AND SCREEN  ABO/RH   Lab Results  Component Value Date   CREATININE 1.30 (H) 03/07/2020   CREATININE 1.33 03/27/2018   CREATININE 1.36 (H) 09/02/2017    IMAGES: Accroding to 03/01/20 [facility] neurosurgery note, "MRI of the cervical spine from January 14, 2020 shows C3-4 and C5-6 rightward mixed predominantly soft spondylotic disc  protrusions with prominent rightward uncovertebral joint hypertrophic change contribute to severe right foraminal stenoses and effacement of the right exiting nerve roots at these levels. C6-7 broad-based mixed predominantly soft spondylotic disc protrusion eccentric rightward and also eccentric in the left foraminal location with moderate to severe biforaminal stenoses. C4-5 broad-based disc  protrusion with ventral thecal sac effacement and moderate to severe biforaminal stenoses."    EKG: 01/11/20 [facility]): NSR   CV: Stress test 08/06/12:  No ischemia; EF 47%;low risk study.   Past Medical History:  Diagnosis Date  . Allergic rhinitis, cause unspecified   . Arthritis of neck   . Arthritis of shoulder    "right shoulder blade" per Tom Wilkerson  . Chest pain, unspecified   . Chicken pox   . Chronic prostatitis   . Clostridium difficile colitis    following course of amoxicillin 10/2012 then Cefdinir 10/2013; recurrent C. difficile after Flagyl and intolerant to Vancoycin; s/p Dificid followed by fecal transplant 10/28/13  . Elevated blood pressure reading without diagnosis of hypertension   . Esophageal reflux   . Hypertension   . Insomnia, unspecified   . Irritability   . Nonspecific abnormal electrocardiogram (ECG) (EKG)   . Other alopecia   . Other and unspecified hyperlipidemia   . Other testicular hypofunction   . Sleep apnea    03/07/2020: "about 3-4 years ago, wears CPAP every night"  . Sleep related leg cramps   . Unspecified contusion of eye   . Unspecified hearing loss   . Unspecified hyperplasia of prostate without urinary obstruction and other lower urinary tract symptoms (LUTS)     Past Surgical History:  Procedure Laterality Date  . DENTAL RESTORATION/EXTRACTION WITH X-RAY    . TONSILLECTOMY  01/31/2009   [name]    MEDICATIONS: . acetaminophen (TYLENOL) 500 MG tablet  . AMBULATORY NON FORMULARY MEDICATION  . Armodafinil 150 MG tablet  . ARTIFICIAL TEAR OINTMENT OP  . buPROPion (WELLBUTRIN XL) 150 MG 24 hr tablet  . Coenzyme Q10 (COQ-10) 100 MG CAPS  . cyclobenzaprine (FLEXERIL) 10 MG tablet  . diphenhydramine-acetaminophen (TYLENOL PM) 25-500 MG TABS tablet  . gabapentin (NEURONTIN) 100 MG capsule  . LORazepam (ATIVAN) 0.5 MG tablet  . Misc Natural Products (TART CHERRY ADVANCED) CAPS  . omeprazole (PRILOSEC) 40 MG  capsule  . pravastatin (PRAVACHOL) 20 MG tablet  . Probiotic Product (PROBIOTIC PO)  . Sodium Chloride-Xylitol (XLEAR SINUS CARE SPRAY NA)  . Solriamfetol HCl (SUNOSI) 75 MG TABS  . Solriamfetol HCl (SUNOSI) 75 MG TABS  . tamsulosin (FLOMAX) 0.4 MG CAPS capsule  . testosterone cypionate (DEPOTESTOSTERONE CYPIONATE) 200 MG/ML injection  . TURMERIC PO  . valsartan (DIOVAN) 320 MG tablet   No current facility-administered medications for this encounter.    Myra Gianotti, PA-C Surgical Short Stay/Anesthesiology [facility] Phone [phone] [facility] Phone [phone] 03/08/2020 3:42 PM

## 2020-03-08 NOTE — Anesthesia Preprocedure Evaluation (Addendum)
Anesthesia Evaluation  Patient identified by MRN, date of birth, ID band Patient awake    Reviewed: Allergy & Precautions, NPO status , Patient's Chart, lab work & pertinent test results  Airway Mallampati: III  TM Distance: >3 FB Neck ROM: Full    Dental no notable dental hx. (+) Teeth Intact, Dental Advisory Given   Pulmonary sleep apnea and Continuous Positive Airway Pressure Ventilation ,  Sleep study 2-3 years ago, compliant with CPAP   Pulmonary exam normal breath sounds clear to auscultation       Cardiovascular hypertension, Pt. on medications Normal cardiovascular exam Rhythm:Regular Rate:Normal     Neuro/Psych PSYCHIATRIC DISORDERS Anxiety    GI/Hepatic Neg liver ROS, GERD  Controlled and Medicated,  Endo/Other  Obesity BMI 33  Renal/GU Renal InsufficiencyRenal diseaseCr 1.3  negative genitourinary   Musculoskeletal  (+) Arthritis , Osteoarthritis,  Cervical spondylosis with radiculopathy   Abdominal (+) + obese,   Peds negative pediatric ROS (+)  Hematology negative hematology ROS (+)   Anesthesia Other Findings   Reproductive/Obstetrics negative OB ROS                           Anesthesia Physical Anesthesia Plan  ASA: III  Anesthesia Plan: General   Post-op Pain Management:    Induction: Intravenous  PONV Risk Score and Plan: 2 and Ondansetron, Dexamethasone, Midazolam and Treatment may vary due to age or medical condition  Airway Management Planned: Oral ETT and Video Laryngoscope Planned  Additional Equipment: None  Intra-op Plan:   Post-operative Plan: Extubation in OR  Informed Consent: I have reviewed the patients History and Physical, chart, labs and discussed the procedure including the risks, benefits and alternatives for the proposed anesthesia with the patient or authorized representative who has indicated his/her understanding and acceptance.      Dental advisory given  Plan Discussed with: CRNA  Anesthesia Plan Comments:        Anesthesia Quick Evaluation

## 2020-03-09 ENCOUNTER — Encounter (HOSPITAL_COMMUNITY)
Admission: RE | Disposition: A | Discharge status: Home / Self Care | Payer: Self-pay | Source: Home / Self Care | Attending: Neurosurgery

## 2020-03-09 ENCOUNTER — Inpatient Hospital Stay (HOSPITAL_COMMUNITY)
Admission: RE | Admit: 2020-03-09 | Discharge: 2020-03-10 | DRG: 473 | Disposition: A | Discharge status: Home / Self Care | Payer: 59 | Attending: Neurosurgery | Admitting: Neurosurgery

## 2020-03-09 ENCOUNTER — Inpatient Hospital Stay (HOSPITAL_COMMUNITY): Payer: 59

## 2020-03-09 ENCOUNTER — Encounter (HOSPITAL_COMMUNITY): Payer: Self-pay | Admitting: Neurosurgery

## 2020-03-09 ENCOUNTER — Inpatient Hospital Stay (HOSPITAL_COMMUNITY): Payer: 59 | Admitting: Vascular Surgery

## 2020-03-09 ENCOUNTER — Inpatient Hospital Stay (HOSPITAL_COMMUNITY): Payer: 59 | Admitting: Certified Registered Nurse Anesthetist

## 2020-03-09 ENCOUNTER — Other Ambulatory Visit: Payer: Self-pay

## 2020-03-09 DIAGNOSIS — Z8042 Family history of malignant neoplasm of prostate: Secondary | ICD-10-CM | POA: Diagnosis not present

## 2020-03-09 DIAGNOSIS — Z888 Allergy status to other drugs, medicaments and biological substances status: Secondary | ICD-10-CM | POA: Diagnosis not present

## 2020-03-09 DIAGNOSIS — E669 Obesity, unspecified: Secondary | ICD-10-CM | POA: Diagnosis present

## 2020-03-09 DIAGNOSIS — M5011 Cervical disc disorder with radiculopathy,  high cervical region: Secondary | ICD-10-CM | POA: Diagnosis present

## 2020-03-09 DIAGNOSIS — G4733 Obstructive sleep apnea (adult) (pediatric): Secondary | ICD-10-CM | POA: Diagnosis present

## 2020-03-09 DIAGNOSIS — M4802 Spinal stenosis, cervical region: Secondary | ICD-10-CM | POA: Diagnosis present

## 2020-03-09 DIAGNOSIS — Z801 Family history of malignant neoplasm of trachea, bronchus and lung: Secondary | ICD-10-CM

## 2020-03-09 DIAGNOSIS — Z6833 Body mass index (BMI) 33.0-33.9, adult: Secondary | ICD-10-CM

## 2020-03-09 DIAGNOSIS — N400 Enlarged prostate without lower urinary tract symptoms: Secondary | ICD-10-CM | POA: Diagnosis present

## 2020-03-09 DIAGNOSIS — M4722 Other spondylosis with radiculopathy, cervical region: Principal | ICD-10-CM | POA: Diagnosis present

## 2020-03-09 DIAGNOSIS — Z8249 Family history of ischemic heart disease and other diseases of the circulatory system: Secondary | ICD-10-CM

## 2020-03-09 DIAGNOSIS — Z83438 Family history of other disorder of lipoprotein metabolism and other lipidemia: Secondary | ICD-10-CM

## 2020-03-09 DIAGNOSIS — Z8051 Family history of malignant neoplasm of kidney: Secondary | ICD-10-CM | POA: Diagnosis not present

## 2020-03-09 DIAGNOSIS — Z8261 Family history of arthritis: Secondary | ICD-10-CM | POA: Diagnosis not present

## 2020-03-09 DIAGNOSIS — Z886 Allergy status to analgesic agent status: Secondary | ICD-10-CM

## 2020-03-09 DIAGNOSIS — K219 Gastro-esophageal reflux disease without esophagitis: Secondary | ICD-10-CM | POA: Diagnosis present

## 2020-03-09 DIAGNOSIS — I10 Essential (primary) hypertension: Secondary | ICD-10-CM | POA: Diagnosis present

## 2020-03-09 DIAGNOSIS — Z419 Encounter for procedure for purposes other than remedying health state, unspecified: Secondary | ICD-10-CM

## 2020-03-09 DIAGNOSIS — Z823 Family history of stroke: Secondary | ICD-10-CM | POA: Diagnosis not present

## 2020-03-09 DIAGNOSIS — Z20822 Contact with and (suspected) exposure to covid-19: Secondary | ICD-10-CM | POA: Diagnosis present

## 2020-03-09 DIAGNOSIS — E785 Hyperlipidemia, unspecified: Secondary | ICD-10-CM | POA: Diagnosis present

## 2020-03-09 DIAGNOSIS — Z885 Allergy status to narcotic agent status: Secondary | ICD-10-CM

## 2020-03-09 HISTORY — PX: ANTERIOR CERVICAL DECOMPRESSION/DISCECTOMY FUSION 4 LEVELS: SHX5556

## 2020-03-09 SURGERY — ANTERIOR CERVICAL DECOMPRESSION/DISCECTOMY FUSION 4 LEVELS
Anesthesia: General | Site: Spine Cervical

## 2020-03-09 MED ORDER — CHLORHEXIDINE GLUCONATE CLOTH 2 % EX PADS: 6.0000 | MEDICATED_PAD | Freq: Once | CUTANEOUS | Status: DC

## 2020-03-09 MED ORDER — PHENYLEPHRINE HCL (PRESSORS) 10 MG/ML IV SOLN
INTRAVENOUS | Status: DC | PRN
  Administered 2020-03-09: 40 ug via INTRAVENOUS
  Administered 2020-03-09: 80 ug via INTRAVENOUS
  Administered 2020-03-09 (×2): 120 ug via INTRAVENOUS

## 2020-03-09 MED ORDER — FENTANYL CITRATE (PF) 250 MCG/5ML IJ SOLN
INTRAMUSCULAR | Status: AC
  Filled 2020-03-09: qty 5

## 2020-03-09 MED ORDER — BACITRACIN ZINC 500 UNIT/GM EX OINT
TOPICAL_OINTMENT | CUTANEOUS | Status: AC
  Filled 2020-03-09: qty 28.35

## 2020-03-09 MED ORDER — BACITRACIN ZINC 500 UNIT/GM EX OINT
TOPICAL_OINTMENT | CUTANEOUS | Status: DC | PRN
  Administered 2020-03-09: 1 via TOPICAL

## 2020-03-09 MED ORDER — PANTOPRAZOLE SODIUM 40 MG IV SOLR: 40.0000 mg | Freq: Every day | INTRAVENOUS | Status: DC

## 2020-03-09 MED ORDER — MENTHOL 3 MG MT LOZG
1.0000 | LOZENGE | OROMUCOSAL | Status: DC | PRN
  Filled 2020-03-09: qty 9

## 2020-03-09 MED ORDER — ACETAMINOPHEN 500 MG PO TABS
1000.0000 mg | ORAL_TABLET | Freq: Four times a day (QID) | ORAL | Status: DC
  Administered 2020-03-09 – 2020-03-10 (×3): 1000 mg via ORAL
  Filled 2020-03-09 (×3): qty 2

## 2020-03-09 MED ORDER — LIDOCAINE-EPINEPHRINE 1 %-1:100000 IJ SOLN
INTRAMUSCULAR | Status: DC | PRN
  Administered 2020-03-09: 10 mL

## 2020-03-09 MED ORDER — DEXAMETHASONE 4 MG PO TABS
4.0000 mg | ORAL_TABLET | Freq: Four times a day (QID) | ORAL | Status: AC
  Administered 2020-03-09 (×2): 4 mg via ORAL
  Filled 2020-03-09 (×2): qty 1

## 2020-03-09 MED ORDER — PRAVASTATIN SODIUM 10 MG PO TABS
20.0000 mg | ORAL_TABLET | Freq: Every day | ORAL | Status: DC
  Administered 2020-03-09: 20 mg via ORAL
  Filled 2020-03-09: qty 2

## 2020-03-09 MED ORDER — PHENYLEPHRINE HCL-NACL 10-0.9 MG/250ML-% IV SOLN
INTRAVENOUS | Status: DC | PRN
  Administered 2020-03-09: 70 ug/min via INTRAVENOUS
  Administered 2020-03-09: 25 ug/min via INTRAVENOUS

## 2020-03-09 MED ORDER — LIDOCAINE-EPINEPHRINE 1 %-1:100000 IJ SOLN
INTRAMUSCULAR | Status: AC
  Filled 2020-03-09: qty 1

## 2020-03-09 MED ORDER — PROPOFOL 10 MG/ML IV BOLUS
INTRAVENOUS | Status: DC | PRN
  Administered 2020-03-09: 200 mg via INTRAVENOUS

## 2020-03-09 MED ORDER — MIDAZOLAM HCL 2 MG/2ML IJ SOLN
INTRAMUSCULAR | Status: DC | PRN
  Administered 2020-03-09: 2 mg via INTRAVENOUS

## 2020-03-09 MED ORDER — FENTANYL CITRATE (PF) 250 MCG/5ML IJ SOLN
INTRAMUSCULAR | Status: DC | PRN
  Administered 2020-03-09: 50 ug via INTRAVENOUS
  Administered 2020-03-09 (×2): 25 ug via INTRAVENOUS
  Administered 2020-03-09: 100 ug via INTRAVENOUS
  Administered 2020-03-09 (×3): 50 ug via INTRAVENOUS

## 2020-03-09 MED ORDER — BISACODYL 10 MG RE SUPP: 10.0000 mg | Freq: Every day | RECTAL | Status: DC | PRN

## 2020-03-09 MED ORDER — LIDOCAINE 2% (20 MG/ML) 5 ML SYRINGE
INTRAMUSCULAR | Status: DC | PRN
  Administered 2020-03-09: 100 mg via INTRAVENOUS

## 2020-03-09 MED ORDER — CYCLOBENZAPRINE HCL 10 MG PO TABS: 10.0000 mg | ORAL_TABLET | Freq: Every day | ORAL | Status: DC | PRN

## 2020-03-09 MED ORDER — OXYCODONE HCL 5 MG PO TABS: 5.0000 mg | ORAL_TABLET | ORAL | Status: DC | PRN

## 2020-03-09 MED ORDER — TAMSULOSIN HCL 0.4 MG PO CAPS
0.4000 mg | ORAL_CAPSULE | Freq: Every day | ORAL | Status: DC | PRN
  Administered 2020-03-09: 0.4 mg via ORAL
  Filled 2020-03-09: qty 1

## 2020-03-09 MED ORDER — SODIUM CHLORIDE 0.9 % IV SOLN
INTRAVENOUS | Status: DC | PRN
  Administered 2020-03-09: 500 mL

## 2020-03-09 MED ORDER — PANTOPRAZOLE SODIUM 40 MG PO TBEC
40.0000 mg | DELAYED_RELEASE_TABLET | Freq: Every day | ORAL | Status: DC
  Administered 2020-03-09: 21:00:00 40 mg via ORAL
  Filled 2020-03-09: qty 1

## 2020-03-09 MED ORDER — THROMBIN 5000 UNITS EX SOLR
OROMUCOSAL | Status: DC | PRN
  Administered 2020-03-09 (×3): 5 mL via TOPICAL

## 2020-03-09 MED ORDER — ACETAMINOPHEN 650 MG RE SUPP: 650.0000 mg | RECTAL | Status: DC | PRN

## 2020-03-09 MED ORDER — CEFAZOLIN SODIUM 1 G IJ SOLR
INTRAMUSCULAR | Status: AC
  Filled 2020-03-09: qty 20

## 2020-03-09 MED ORDER — HYDROMORPHONE HCL 1 MG/ML IJ SOLN: 0.2500 mg | INTRAMUSCULAR | Status: DC | PRN

## 2020-03-09 MED ORDER — LACTATED RINGERS IV SOLN: INTRAVENOUS | Status: DC

## 2020-03-09 MED ORDER — PHENOL 1.4 % MT LIQD
1.0000 | OROMUCOSAL | Status: DC | PRN
  Administered 2020-03-09: 1 via OROMUCOSAL
  Filled 2020-03-09: qty 177

## 2020-03-09 MED ORDER — PROMETHAZINE HCL 25 MG/ML IJ SOLN: 6.2500 mg | INTRAMUSCULAR | Status: DC | PRN

## 2020-03-09 MED ORDER — OXYCODONE HCL 5 MG/5ML PO SOLN: 5.0000 mg | Freq: Once | ORAL | Status: DC | PRN

## 2020-03-09 MED ORDER — DEXAMETHASONE SODIUM PHOSPHATE 10 MG/ML IJ SOLN
INTRAMUSCULAR | Status: AC
  Filled 2020-03-09: qty 1

## 2020-03-09 MED ORDER — LACTATED RINGERS IV SOLN: INTRAVENOUS | Status: DC | PRN

## 2020-03-09 MED ORDER — DEXAMETHASONE SODIUM PHOSPHATE 10 MG/ML IJ SOLN
INTRAMUSCULAR | Status: DC | PRN
  Administered 2020-03-09: 10 mg via INTRAVENOUS

## 2020-03-09 MED ORDER — ALUM & MAG HYDROXIDE-SIMETH 200-200-20 MG/5ML PO SUSP: 30.0000 mL | Freq: Four times a day (QID) | ORAL | Status: DC | PRN

## 2020-03-09 MED ORDER — THROMBIN 5000 UNITS EX SOLR
CUTANEOUS | Status: AC
  Filled 2020-03-09: qty 5000

## 2020-03-09 MED ORDER — ROCURONIUM BROMIDE 10 MG/ML (PF) SYRINGE
PREFILLED_SYRINGE | INTRAVENOUS | Status: DC | PRN
  Administered 2020-03-09: 100 mg via INTRAVENOUS

## 2020-03-09 MED ORDER — OXYCODONE HCL 5 MG PO TABS: 5.0000 mg | ORAL_TABLET | Freq: Once | ORAL | Status: DC | PRN

## 2020-03-09 MED ORDER — ONDANSETRON HCL 4 MG/2ML IJ SOLN
INTRAMUSCULAR | Status: DC | PRN
  Administered 2020-03-09: 4 mg via INTRAVENOUS

## 2020-03-09 MED ORDER — MORPHINE SULFATE (PF) 4 MG/ML IV SOLN: 4.0000 mg | INTRAVENOUS | Status: DC | PRN

## 2020-03-09 MED ORDER — LIDOCAINE 2% (20 MG/ML) 5 ML SYRINGE
INTRAMUSCULAR | Status: AC
  Filled 2020-03-09: qty 5

## 2020-03-09 MED ORDER — CYCLOBENZAPRINE HCL 10 MG PO TABS
10.0000 mg | ORAL_TABLET | Freq: Three times a day (TID) | ORAL | Status: DC | PRN
  Administered 2020-03-09 – 2020-03-10 (×2): 10 mg via ORAL
  Filled 2020-03-09 (×2): qty 1

## 2020-03-09 MED ORDER — LORAZEPAM 0.5 MG PO TABS: 0.5000 mg | ORAL_TABLET | Freq: Every day | ORAL | Status: DC | PRN

## 2020-03-09 MED ORDER — DOCUSATE SODIUM 100 MG PO CAPS
100.0000 mg | ORAL_CAPSULE | Freq: Two times a day (BID) | ORAL | Status: DC
  Administered 2020-03-09 – 2020-03-10 (×3): 100 mg via ORAL
  Filled 2020-03-09 (×3): qty 1

## 2020-03-09 MED ORDER — 0.9 % SODIUM CHLORIDE (POUR BTL) OPTIME
TOPICAL | Status: DC | PRN
  Administered 2020-03-09: 1000 mL

## 2020-03-09 MED ORDER — ONDANSETRON HCL 4 MG/2ML IJ SOLN: 4.0000 mg | Freq: Four times a day (QID) | INTRAMUSCULAR | Status: DC | PRN

## 2020-03-09 MED ORDER — ONDANSETRON HCL 4 MG PO TABS: 4.0000 mg | ORAL_TABLET | Freq: Four times a day (QID) | ORAL | Status: DC | PRN

## 2020-03-09 MED ORDER — ALBUMIN HUMAN 5 % IV SOLN: INTRAVENOUS | Status: DC | PRN

## 2020-03-09 MED ORDER — ROCURONIUM BROMIDE 10 MG/ML (PF) SYRINGE
PREFILLED_SYRINGE | INTRAVENOUS | Status: AC
  Filled 2020-03-09: qty 10

## 2020-03-09 MED ORDER — CEFAZOLIN SODIUM-DEXTROSE 2-4 GM/100ML-% IV SOLN
2.0000 g | Freq: Three times a day (TID) | INTRAVENOUS | Status: AC
  Administered 2020-03-09 – 2020-03-10 (×2): 2 g via INTRAVENOUS
  Filled 2020-03-09 (×2): qty 100

## 2020-03-09 MED ORDER — COQ-10 100 MG PO CAPS: 100.0000 mg | ORAL_CAPSULE | Freq: Every day | ORAL | Status: DC

## 2020-03-09 MED ORDER — ONDANSETRON HCL 4 MG/2ML IJ SOLN
INTRAMUSCULAR | Status: AC
  Filled 2020-03-09: qty 2

## 2020-03-09 MED ORDER — ACETAMINOPHEN 325 MG PO TABS: 650.0000 mg | ORAL_TABLET | ORAL | Status: DC | PRN

## 2020-03-09 MED ORDER — NEOSTIGMINE METHYLSULFATE 10 MG/10ML IV SOLN
INTRAVENOUS | Status: DC | PRN
  Administered 2020-03-09: 3 mg via INTRAVENOUS

## 2020-03-09 MED ORDER — IRBESARTAN 300 MG PO TABS
300.0000 mg | ORAL_TABLET | Freq: Every day | ORAL | Status: DC
  Administered 2020-03-09: 300 mg via ORAL
  Filled 2020-03-09 (×2): qty 1

## 2020-03-09 MED ORDER — CEFAZOLIN SODIUM-DEXTROSE 2-4 GM/100ML-% IV SOLN
2.0000 g | INTRAVENOUS | Status: AC
  Administered 2020-03-09: 2 g via INTRAVENOUS
  Filled 2020-03-09: qty 100

## 2020-03-09 MED ORDER — PROPOFOL 10 MG/ML IV BOLUS
INTRAVENOUS | Status: AC
  Filled 2020-03-09: qty 40

## 2020-03-09 MED ORDER — GABAPENTIN 100 MG PO CAPS
200.0000 mg | ORAL_CAPSULE | Freq: Three times a day (TID) | ORAL | Status: DC
  Administered 2020-03-09 – 2020-03-10 (×3): 200 mg via ORAL
  Filled 2020-03-09 (×3): qty 2

## 2020-03-09 MED ORDER — GLYCOPYRROLATE 0.2 MG/ML IJ SOLN
INTRAMUSCULAR | Status: DC | PRN
  Administered 2020-03-09: .4 mg via INTRAVENOUS

## 2020-03-09 MED ORDER — BUPROPION HCL ER (XL) 150 MG PO TB24
150.0000 mg | ORAL_TABLET | Freq: Every day | ORAL | Status: DC
  Administered 2020-03-10: 150 mg via ORAL
  Filled 2020-03-09: qty 1

## 2020-03-09 MED ORDER — DEXAMETHASONE SODIUM PHOSPHATE 4 MG/ML IJ SOLN: 4.0000 mg | Freq: Four times a day (QID) | INTRAMUSCULAR | Status: AC

## 2020-03-09 MED ORDER — ACETAMINOPHEN 500 MG PO TABS
1000.0000 mg | ORAL_TABLET | Freq: Once | ORAL | Status: AC
  Administered 2020-03-09: 1000 mg via ORAL
  Filled 2020-03-09: qty 2

## 2020-03-09 MED ORDER — OXYCODONE HCL 5 MG PO TABS
10.0000 mg | ORAL_TABLET | ORAL | Status: DC | PRN
  Administered 2020-03-09 – 2020-03-10 (×6): 10 mg via ORAL
  Filled 2020-03-09 (×6): qty 2

## 2020-03-09 MED ORDER — PANTOPRAZOLE SODIUM 40 MG PO TBEC: 40.0000 mg | DELAYED_RELEASE_TABLET | Freq: Every day | ORAL | Status: DC

## 2020-03-09 MED ORDER — PHENYLEPHRINE 40 MCG/ML (10ML) SYRINGE FOR IV PUSH (FOR BLOOD PRESSURE SUPPORT)
PREFILLED_SYRINGE | INTRAVENOUS | Status: AC
  Filled 2020-03-09: qty 10

## 2020-03-09 MED ORDER — MIDAZOLAM HCL 2 MG/2ML IJ SOLN
INTRAMUSCULAR | Status: AC
  Filled 2020-03-09: qty 2

## 2020-03-09 MED ORDER — THROMBIN 20000 UNITS EX SOLR
CUTANEOUS | Status: AC
  Filled 2020-03-09: qty 20000

## 2020-03-09 SURGICAL SUPPLY — 75 items
APL SKNCLS STERI-STRIP NONHPOA (GAUZE/BANDAGES/DRESSINGS) ×1
BAG DECANTER FOR FLEXI CONT (MISCELLANEOUS) ×2 IMPLANT
BASKET BONE COLLECTION (BASKET) ×1 IMPLANT
BENZOIN TINCTURE PRP APPL 2/3 (GAUZE/BANDAGES/DRESSINGS) ×3 IMPLANT
BIT DRILL NEURO 2X3.1 SFT TUCH (MISCELLANEOUS) ×1 IMPLANT
BLADE SURG 15 STRL LF DISP TIS (BLADE) ×1 IMPLANT
BLADE SURG 15 STRL SS (BLADE) ×2
BLADE ULTRA TIP 2M (BLADE) ×2 IMPLANT
BUR BARREL STRAIGHT FLUTE 4.0 (BURR) ×3 IMPLANT
BUR MATCHSTICK NEURO 3.0 LAGG (BURR) ×2 IMPLANT
CANISTER SUCT 3000ML PPV (MISCELLANEOUS) ×2 IMPLANT
CARTRIDGE OIL MAESTRO DRILL (MISCELLANEOUS) ×1 IMPLANT
COVER MAYO STAND STRL (DRAPES) ×2 IMPLANT
COVER WAND RF STERILE (DRAPES) ×2 IMPLANT
DECANTER SPIKE VIAL GLASS SM (MISCELLANEOUS) ×2 IMPLANT
DEVICE FUSION VIST S 14X14X6MM (Trauma) IMPLANT
DIFFUSER DRILL AIR PNEUMATIC (MISCELLANEOUS) ×2 IMPLANT
DRAIN JACKSON PRATT 10MM FLAT (MISCELLANEOUS) ×1 IMPLANT
DRAPE LAPAROTOMY 100X72 PEDS (DRAPES) ×2 IMPLANT
DRAPE MICROSCOPE LEICA (MISCELLANEOUS) IMPLANT
DRAPE SURG 17X23 STRL (DRAPES) ×4 IMPLANT
DRILL NEURO 2X3.1 SOFT TOUCH (MISCELLANEOUS) ×2
DRSG OPSITE POSTOP 4X6 (GAUZE/BANDAGES/DRESSINGS) ×1 IMPLANT
ELECT BLADE 4.0 EZ CLEAN MEGAD (MISCELLANEOUS) ×2
ELECT REM PT RETURN 9FT ADLT (ELECTROSURGICAL) ×2
ELECTRODE BLDE 4.0 EZ CLN MEGD (MISCELLANEOUS) IMPLANT
ELECTRODE REM PT RTRN 9FT ADLT (ELECTROSURGICAL) ×1 IMPLANT
EVACUATOR SILICONE 100CC (DRAIN) ×1 IMPLANT
GAUZE 4X4 16PLY RFD (DISPOSABLE) IMPLANT
GAUZE SPONGE 4X4 12PLY STRL (GAUZE/BANDAGES/DRESSINGS) ×1 IMPLANT
GAUZE SPONGE 4X4 12PLY STRL LF (GAUZE/BANDAGES/DRESSINGS) ×1 IMPLANT
GLOVE BIO SURGEON STRL SZ 6.5 (GLOVE) ×3 IMPLANT
GLOVE BIO SURGEON STRL SZ7 (GLOVE) ×1 IMPLANT
GLOVE BIO SURGEON STRL SZ8 (GLOVE) ×2 IMPLANT
GLOVE BIO SURGEON STRL SZ8.5 (GLOVE) ×2 IMPLANT
GLOVE BIOGEL PI IND STRL 6.5 (GLOVE) IMPLANT
GLOVE BIOGEL PI IND STRL 7.0 (GLOVE) IMPLANT
GLOVE BIOGEL PI INDICATOR 6.5 (GLOVE) ×4
GLOVE BIOGEL PI INDICATOR 7.0 (GLOVE) ×2
GLOVE ECLIPSE 6.5 STRL STRAW (GLOVE) ×2 IMPLANT
GLOVE EXAM NITRILE XL STR (GLOVE) IMPLANT
GOWN STRL REUS W/ TWL LRG LVL3 (GOWN DISPOSABLE) IMPLANT
GOWN STRL REUS W/ TWL XL LVL3 (GOWN DISPOSABLE) IMPLANT
GOWN STRL REUS W/TWL LRG LVL3 (GOWN DISPOSABLE) ×8
GOWN STRL REUS W/TWL XL LVL3 (GOWN DISPOSABLE) ×4
HEMOSTAT POWDER KIT SURGIFOAM (HEMOSTASIS) ×4 IMPLANT
KIT BASIN OR (CUSTOM PROCEDURE TRAY) ×2 IMPLANT
KIT TURNOVER KIT B (KITS) ×2 IMPLANT
MARKER SKIN DUAL TIP RULER LAB (MISCELLANEOUS) ×2 IMPLANT
NDL SPNL 18GX3.5 QUINCKE PK (NEEDLE) ×1 IMPLANT
NEEDLE HYPO 22GX1.5 SAFETY (NEEDLE) ×2 IMPLANT
NEEDLE SPNL 18GX3.5 QUINCKE PK (NEEDLE) ×2 IMPLANT
NS IRRIG 1000ML POUR BTL (IV SOLUTION) ×2 IMPLANT
OIL CARTRIDGE MAESTRO DRILL (MISCELLANEOUS) ×2
PACK LAMINECTOMY NEURO (CUSTOM PROCEDURE TRAY) ×2 IMPLANT
PATTIES SURGICAL .5 X.5 (GAUZE/BANDAGES/DRESSINGS) ×1 IMPLANT
PATTIES SURGICAL 1X1 (DISPOSABLE) ×2 IMPLANT
PEEK VISTA 14X14X7MM (Peek) ×2 IMPLANT
PEEK VISTA 14X14X8MM (Peek) ×2 IMPLANT
PIN DISTRACTION 14MM (PIN) ×4 IMPLANT
PLATE ANT CERV XTEND 4 LV 72 (Plate) ×1 IMPLANT
PUTTY DBM 5CC CALC GRAN ×1 IMPLANT
RUBBERBAND STERILE (MISCELLANEOUS) IMPLANT
SCREW XTD VAR 4.2 SELF TAP (Screw) ×10 IMPLANT
SPONGE INTESTINAL PEANUT (DISPOSABLE) ×3 IMPLANT
SPONGE LAP 4X18 RFD (DISPOSABLE) ×1 IMPLANT
SPONGE SURGIFOAM ABS GEL 100 (HEMOSTASIS) IMPLANT
STRIP CLOSURE SKIN 1/2X4 (GAUZE/BANDAGES/DRESSINGS) ×2 IMPLANT
SUT VIC AB 0 CT1 27 (SUTURE) ×4
SUT VIC AB 0 CT1 27XBRD ANTBC (SUTURE) ×1 IMPLANT
SUT VIC AB 3-0 SH 8-18 (SUTURE) ×3 IMPLANT
TOWEL GREEN STERILE (TOWEL DISPOSABLE) ×2 IMPLANT
TOWEL GREEN STERILE FF (TOWEL DISPOSABLE) ×2 IMPLANT
VISTA S O 14X14X6MM (Trauma) ×2 IMPLANT
WATER STERILE IRR 1000ML POUR (IV SOLUTION) ×2 IMPLANT

## 2020-03-09 NOTE — H&P (Signed)
Subjective: The patient is a 54 year old white male who has complained of neck and right arm pain consistent with a cervical radiculopathy.  He has failed medical management and was worked up with a cervical MRI which demonstrated multilevel spondylosis and neuroforaminal stenosis.  I discussed the various treatment options with patient including surgery.  Patient has weighed the risks, benefits and alternatives and decided proceed with a 4 level anterior cervical discectomy, fusion and plating.  Past Medical History:  Diagnosis Date  . Allergic rhinitis, cause unspecified   . Arthritis of neck   . Arthritis of shoulder    "right shoulder blade" per patient  . Chest pain, unspecified   . Chicken pox   . Chronic prostatitis   . Clostridium difficile colitis    following course of amoxicillin 10/2012 then Cefdinir 10/2013; recurrent C. difficile after Flagyl and intolerant to Vancoycin; s/p Dificid followed by fecal transplant 10/28/13  . Elevated blood pressure reading without diagnosis of hypertension   . Esophageal reflux   . Hypertension   . Insomnia, unspecified   . Irritability   . Nonspecific abnormal electrocardiogram (ECG) (EKG)   . Other alopecia   . Other and unspecified hyperlipidemia   . Other testicular hypofunction   . Sleep apnea    03/07/2020: "about 3-4 years ago, wears CPAP every night"  . Sleep related leg cramps   . Unspecified contusion of eye   . Unspecified hearing loss   . Unspecified hyperplasia of prostate without urinary obstruction and other lower urinary tract symptoms (LUTS)     Past Surgical History:  Procedure Laterality Date  . DENTAL RESTORATION/EXTRACTION WITH X-RAY    . TONSILLECTOMY  01/31/2009   [name]    Allergies  Allergen Reactions  . Bee Venom Swelling    Extreme localized swelling   . Codeine     Insomnia and Hyperactivity  . Ibuprofen Itching and Swelling    Facial swelling  . Nsaids Other (See Comments)    Facial swelling  .  Amlodipine Other (See Comments)    dizziness    Social History   Tobacco Use  . Smoking status: Never Smoker  . Smokeless tobacco: Never Used  Substance Use Topics  . Alcohol use: Yes    Comment: occasional socially twice weekly beer    Family History  Problem Relation Age of Onset  . Hypertension Mother   . Lung cancer Mother   . Depression Mother   . Hyperlipidemia Mother   . Arthritis Mother   . Kidney cancer Mother   . Cancer Mother        kidney, lung  . CAD Father        CABG age 105  . Hypertension Father   . Stroke Father   . Hyperlipidemia Father   . Prostate cancer Father   . Congestive Heart Failure Father   . Cancer Father        prostate  . Cancer Other        malignancy prostate  . Migraines Sister   . Liver disease Brother        on liver transplant list  . Ulcerative colitis Brother   . COPD Neg Hx   . Diabetes Neg Hx   . Heart disease Neg Hx    Prior to Admission medications   Medication Sig Start Date End Date Taking? Authorizing Provider  acetaminophen (TYLENOL) 500 MG tablet Take 1,000 mg by mouth every 6 (six) hours as needed for moderate pain or headache.  Yes [provider]  ARTIFICIAL TEAR OINTMENT OP Place 1 application into both eyes daily as needed (dry eyes).   Yes [provider]  buPROPion (WELLBUTRIN XL) 150 MG 24 hr tablet TAKE ONE TABLET BY MOUTH EVERY DAY Patient taking differently: Take 150 mg by mouth daily.  05/14/18  Yes Lada, Satira Anis, MD  Coenzyme Q10 (COQ-10) 100 MG CAPS Take 100 mg by mouth at bedtime.   Yes [provider]  cyclobenzaprine (FLEXERIL) 10 MG tablet Take 10 mg by mouth daily as needed for muscle spasms.   Yes [provider]  diphenhydramine-acetaminophen (TYLENOL PM) 25-500 MG TABS tablet Take 2 tablets by mouth at bedtime as needed (sleep).   Yes [provider]  gabapentin (NEURONTIN) 100 MG capsule Take 2 capsules (200 mg total) by mouth at bedtime. Patient  taking differently: Take 200-300 mg by mouth See admin instructions. Take 200 mg in the morning, 200 mg in the afternoon, and 200-300 mg at bedtime 01/21/20  Yes Hulan Saas M, DO  LORazepam (ATIVAN) 0.5 MG tablet One by mouth every six hours if needed for stormy days or sleep Patient taking differently: Take 0.5 mg by mouth daily as needed for anxiety.  10/02/18  Yes Lada, Satira Anis, MD  Misc Natural Products (TART CHERRY ADVANCED) CAPS Take 2 capsules by mouth every evening.   Yes [provider]  omeprazole (PRILOSEC) 40 MG capsule Take 40 mg by mouth every evening. 02/07/20  Yes [provider]  pravastatin (PRAVACHOL) 20 MG tablet TAKE 1 TABLET EVERY OTHER DAY AT BEDTIME Patient taking differently: Take 20 mg by mouth at bedtime.  04/17/19  Yes Hubbard Hartshorn, FNP  Probiotic Product (PROBIOTIC PO) Take 1 capsule by mouth daily.    Yes [provider]  Sodium Chloride-Xylitol (XLEAR SINUS CARE SPRAY NA) Place 1 spray into the nose daily as needed (allergies).   Yes [provider]  tamsulosin (FLOMAX) 0.4 MG CAPS capsule Take 0.4 mg by mouth daily as needed (urinary flow issues).   Yes [provider]  testosterone cypionate (DEPOTESTOSTERONE CYPIONATE) 200 MG/ML injection Inject 140 mg into the muscle every Tuesday. At night   Yes [provider]  TURMERIC PO Take 1 tablet by mouth every evening.   Yes [provider]  valsartan (DIOVAN) 320 MG tablet Take 320 mg by mouth at bedtime.   Yes [provider]  AMBULATORY NON [facility] Patient has OSA or probable OSA (Yes or No): yes Is the patient currently using CPAP within the home? (Yes or No): yes If yes to question 2, what is the current DME provider?  Are there any changes to the order/settings? (if yes, please specify) no If no to question 2, date of sleep study: 10/22/17 Date of face-to-face encounter: 01/13/2018 Settings: auto 5-20 cm h2O Signs and  symptoms or probable OSA, mention all that apply (snoring) (morning  Headaches) (witnessed apneas) (choking) (gasping during sleep): snoring, apnea Resmed S/O air/auto with heated humidity.   Enroll in [employer] [facility] [employer] CPAP supplies needed: mask of choice, headgear, cushions, filters, climate control tubing and water chamber. 08/24/18   Laverle Hobby, MD  Armodafinil 150 MG tablet Take 1 tablet (150 mg total) by mouth daily. Patient not taking: Reported on 02/29/2020 12/27/19   Laurin Coder, MD  Solriamfetol HCl (SUNOSI) 75 MG TABS Take 75 mg by mouth daily. Patient not taking: Reported on 02/29/2020 12/14/19   Laurin Coder, MD  Solriamfetol HCl (  SUNOSI) 75 MG TABS Take 1 tablet by mouth daily. Patient not taking: Reported on 02/29/2020 12/14/19   Laurin Coder, MD     Review of Systems  Positive ROS: As above  All other systems have been reviewed and were otherwise negative with the exception of those mentioned in the HPI and as above.  Objective: Vital signs in last 24 hours: Temp:  [97.5 F (36.4 C)] 97.5 F (36.4 C) (04/22 0615) Pulse Rate:  [80] 80 (04/22 0615) Resp:  [18] 18 (04/22 0615) BP: (143)/(89) 143/89 (04/22 0615) SpO2:  [95 %] 95 % (04/22 0615) Estimated body mass index is 33.26 kg/m as calculated from the following:   Height as of 03/07/20: 6' (1.829 m).   Weight as of 03/07/20: 111.2 kg.   General Appearance: Alert Head: Normocephalic, without obvious abnormality, atraumatic Eyes: PERRL, conjunctiva/corneas clear, EOM's intact,    Ears: Normal  Throat: Normal  Neck: Supple, Back: unremarkable Lungs: Clear to auscultation bilaterally, respirations unlabored Heart: Regular rate and rhythm, no murmur, rub or gallop Abdomen: Soft, non-tender Extremities: Extremities normal, atraumatic, no cyanosis or edema Skin: unremarkable  NEUROLOGIC:   Mental status: alert and oriented,Motor Exam - grossly normal Sensory Exam - grossly  normal Reflexes:  Coordination - grossly normal Gait - grossly normal Balance - grossly normal Cranial Nerves: I: smell Not tested  II: visual acuity  OS: Normal  OD: Normal   II: visual fields Full to confrontation  II: pupils Equal, round, reactive to light  III,VII: ptosis None  III,IV,VI: extraocular muscles  Full ROM  V: mastication Normal  V: facial light touch sensation  Normal  V,VII: corneal reflex  Present  VII: facial muscle function - upper  Normal  VII: facial muscle function - lower Normal  VIII: hearing Not tested  IX: soft palate elevation  Normal  IX,X: gag reflex Present  XI: trapezius strength  5/5  XI: sternocleidomastoid strength 5/5  XI: neck flexion strength  5/5  XII: tongue strength  Normal    Data Review Lab Results  Component Value Date   WBC 6.7 03/07/2020   HGB 15.5 03/07/2020   HCT 49.3 03/07/2020   MCV 82.2 03/07/2020   PLT 257 03/07/2020   Lab Results  Component Value Date   NA 141 03/07/2020   K 3.8 03/07/2020   CL 102 03/07/2020   CO2 30 03/07/2020   BUN 14 03/07/2020   CREATININE 1.30 (H) 03/07/2020   GLUCOSE 81 03/07/2020   No results found for: INR, PROTIME  Assessment/Plan: C3-4, C4-5, C5-6 and C6-7 spondylosis, stenosis, cervical radiculopathy, cervicalgia: I have discussed the situation with the patient.  I have reviewed his imaging studies with him and pointed out the abnormalities.  We have discussed the various treatment options including surgery.  I have described the surgical treatment option of a C3-4, C4-5, C5-6 and C6-7 anterior cervical discectomy, fusion and plating.  I have shown him surgical models.  I have given him a surgical pamphlet.  We have discussed the risks, benefits, alternatives, expected postoperative course, and likelihood of achieving our goals with surgery.  I have answered all his questions.  He has decided to proceed with surgery.   Ophelia Charter 03/09/2020 7:21 AM

## 2020-03-09 NOTE — Progress Notes (Signed)
Orthopedic Tech Progress Note Patient Details:  Tom Wilkerson 03/23/1966 [id number] RN said patient has collar Patient ID: [name], male   DOB: Mar 08, 1966, 54 y.o.   MRN: [id number]   [name] 03/09/2020, 4:48 PM

## 2020-03-09 NOTE — Anesthesia Procedure Notes (Signed)
Procedure Name: Intubation Date/Time: 03/09/2020 7:48 AM Performed by: Verdie Drown, CRNA Pre-anesthesia Checklist: Patient identified, Emergency Drugs available, Suction available, Patient being monitored and Timeout performed Patient Re-evaluated:Patient Re-evaluated prior to induction Oxygen Delivery Method: Circle system utilized Preoxygenation: Pre-oxygenation with 100% oxygen Induction Type: IV induction Laryngoscope Size: Glidescope and 4 Grade View: Grade I Tube type: Oral Tube size: 7.5 mm Number of attempts: 1 Airway Equipment and Method: Stylet and Video-laryngoscopy Placement Confirmation: ETT inserted through vocal cords under direct vision,  breath sounds checked- equal and bilateral and CO2 detector Secured at: 23 cm Tube secured with: Tape Dental Injury: Teeth and Oropharynx as per pre-operative assessment  Difficulty Due To: Difficulty was anticipated, Difficult Airway-  due to edematous airway, Difficult Airway- due to limited oral opening and Difficult Airway- due to reduced neck mobility Comments: Pt head/neck remain in midline and neutral position of comfort for induction and intubation.

## 2020-03-09 NOTE — Op Note (Signed)
Brief history: The patient is a 54 year old white male who has complained of neck and right arm pain and numbness.  He has failed medical management.  He was worked up with a cervical MRI which demonstrated multilevel herniated disc and spondylosis.  I discussed the various treatment options.  He has decided to proceed with surgery after weighing the risks, benefits and alternatives.  Preoperative diagnosis: C3-4, C4-5, C5-6 and C6-7 herniated disc, spondylosis, stenosis, cervical radiculopathy, cervicalgia  Postoperative diagnosis: The same  Procedure: C3-4, C4-5, C5-6 and C6-7 anterior cervical discectomy/decompression; C3-4, C4-5, C5-6, C6-7 interbody arthrodesis with local morcellized autograft bone and Zimmer DBM; insertion of interbody prosthesis at C3-4, C4-5, C5-6, C6-7 (Zimmer peek interbody prosthesis); anterior cervical plating from C3-C7 with globus titanium plate  Surgeon: Dr. Earle Gell  Asst.: Dr. Lorin Glass and Arnetha Massy, NP  Anesthesia: Gen. endotracheal  Estimated blood loss: 200 cc  Drains: 110 mm flat Jackson-Pratt in the prevertebral space.  Complications: None  Description of procedure: The patient was brought to the operating room by the anesthesia team. General endotracheal anesthesia was induced. A roll was placed under the patient's shoulders to keep the neck in the neutral position. The patient's anterior cervical region was then prepared with Betadine scrub and Betadine solution. Sterile drapes were applied.  The area to be incised was then injected with Marcaine with epinephrine solution. I then used a scalpel to make a transverse incision in the patient's left anterior neck. I used the Metzenbaum scissors to divide the platysmal muscle and then to dissect medial to the sternocleidomastoid muscle, jugular vein, and carotid artery. I carefully dissected down towards the anterior cervical spine identifying the esophagus and retracting it medially. Then using  Kitner swabs to clear soft tissue from the anterior cervical spine. We then inserted a bent spinal needle into the upper exposed intervertebral disc space. We then obtained intraoperative radiographs confirm our location.  I then used electrocautery to detach the medial border of the longus colli muscle bilaterally from the C3-4, C4-5, C5-6 and C6-7 intervertebral disc spaces. I then inserted the Caspar self-retaining retractor underneath the longus colli muscle bilaterally to provide exposure.  We then incised the intervertebral disc at C3-4. We then performed a partial intervertebral discectomy with a pituitary forceps and the Karlin curettes. I then inserted distraction screws into the vertebral bodies at C3 and C4. We then distracted the interspace. We then used the high-speed drill to decorticate the vertebral endplates at [id number], to drill away the remainder of the intervertebral disc, to drill away some posterior spondylosis, and to thin out the posterior longitudinal ligament. I then incised ligament with the arachnoid knife. We then removed the ligament with a Kerrison punches undercutting the vertebral endplates and decompressing the thecal sac. We then performed foraminotomies about the bilateral C4 nerve roots. This completed the decompression at this level.  We then repeated this procedure in analogous fashion at C4-5, C5-6 and C6-7 decompressing the thecal sac and the bilateral C5, C6 and C7 nerve roots.  We now turned our to attention to the interbody fusion. We used the trial spacers to determine the appropriate size for the interbody prosthesis. We then pre-filled prosthesis with a combination of local morcellized autograft bone that we obtained during decompression as well as Zimmer DBM (because there was not enough autograft bone). We then inserted the prosthesis into the distracted interspace at C3-4, C4-5 and C5-6. We then removed the distraction screws. There was a good snug fit of the  prosthesis in the interspace.  Having completed the fusion we now turned attention to the anterior spinal instrumentation. We used the high-speed drill to drill away some anterior spondylosis at the disc spaces so that the plate lay down flat. We selected the appropriate length titanium anterior cervical plate. We laid it along the anterior aspect of the vertebral bodies from C3-C7. We then drilled 14 mm holes at C3, C4, C5, C6 and C7. We then secured the plate to the vertebral bodies by placing two 14 mm self-tapping screws at C3, C4, C5, C6 and C7. We then obtained intraoperative radiograph. The demonstrating good position of the upper instrumentation.  The lower instrumentation looked good in vivo.  We therefore secured the screws the plate the locking each cam. This completed the instrumentation.  We then obtained hemostasis using bipolar electrocautery. We irrigated the wound out with bacitracin solution. We then removed the retractor. We inspected the esophagus for any damage. There was none apparent.  We placed a 10 mm flat Jackson-Pratt drain in the prevertebral space and tunneled it out through a separate stab wound.  We then reapproximated patient's platysmal muscle with interrupted 3-0 Vicryl suture. We then reapproximated the subcutaneous tissue with interrupted 3-0 Vicryl suture. The skin was reapproximated with Steri-Strips and benzoin. The wound was then covered with bacitracin ointment. A sterile dressing was applied. The drapes were removed. Patient was subsequently extubated by the anesthesia team and transported to the post anesthesia care unit in stable condition. All sponge instrument and needle counts were reportedly correct at the end of this case.

## 2020-03-09 NOTE — Anesthesia Postprocedure Evaluation (Signed)
Anesthesia Post Note  Patient: Tom Wilkerson  Procedure(s) Performed: ANTERIOR CERVICAL DECOMPRESSION/DISCECTOMY FUSION, INTERBODY PROSTHESIS, PLATE/SCREWS,CERVICAL THREE-FOUR,CERVICAL FOUR-FIVE, CERVICAL FIVE-SIX,CERVICAL SIX-SEVEN (N/A Spine Cervical)     Patient location during evaluation: PACU Anesthesia Type: General Level of consciousness: awake and alert, oriented and patient cooperative Pain management: pain level controlled Vital Signs Assessment: post-procedure vital signs reviewed and stable Respiratory status: spontaneous breathing, nonlabored ventilation and respiratory function stable Cardiovascular status: blood pressure returned to baseline and stable Postop Assessment: no apparent nausea or vomiting Anesthetic complications: no    Last Vitals:  Vitals:   03/09/20 1257 03/09/20 1309  BP:    Pulse: (!) 110 96  Resp:  (!) 22  Temp: 37.1 C   SpO2:  90%    Last Pain:  Vitals:   03/09/20 1309  TempSrc:   PainSc: Asleep    LLE Motor Response: Purposeful movement (03/09/20 1309) LLE Sensation: Full sensation (03/09/20 1309) RLE Motor Response: Purposeful movement (03/09/20 1309) RLE Sensation: Full sensation (03/09/20 [name])      Tom Wilkerson

## 2020-03-09 NOTE — Progress Notes (Signed)
Subjective: The patient is somnolent but arousable.  He is in no apparent distress.  Objective: Vital signs in last 24 hours: Temp:  [97.5 F (36.4 C)-98.8 F (37.1 C)] 98.8 F (37.1 C) (04/22 1257) Pulse Rate:  [80-110] 96 (04/22 1309) Resp:  [18-22] 22 (04/22 1309) BP: (138-143)/(76-89) 138/76 (04/22 1324) SpO2:  [90 %-95 %] 90 % (04/22 1309) Estimated body mass index is 33.26 kg/m as calculated from the following:   Height as of 03/07/20: 6' (1.829 m).   Weight as of 03/07/20: 111.2 kg.   Intake/Output from previous day: No intake/output data recorded. Intake/Output this shift: Total I/O In: 2500 [I.V.:2000; IV Piggyback:500] Out: 1050 [Urine:540; Blood:510]  Physical exam the patient is somnolent but arousable.  He is moving all 4 extremities well.  The patient's dressing is clean and dry.  There is no hematoma or shift.  Lab Results: Recent Labs    03/07/20 1135  WBC 6.7  HGB 15.5  HCT 49.3  PLT 257   BMET Recent Labs    03/07/20 1135  NA 141  K 3.8  CL 102  CO2 30  GLUCOSE 81  BUN 14  CREATININE 1.30*  CALCIUM 9.5    Studies/Results: No results found.  Assessment/Plan: The patient is doing well.  I spoke with his wife.  LOS: 0 days     Ophelia Charter 03/09/2020, 1:49 PM

## 2020-03-09 NOTE — Transfer of Care (Signed)
Immediate Anesthesia Transfer of Care Note  Patient: Tom Wilkerson  Procedure(s) Performed: ANTERIOR CERVICAL DECOMPRESSION/DISCECTOMY FUSION, INTERBODY PROSTHESIS, PLATE/SCREWS,CERVICAL THREE-FOUR,CERVICAL FOUR-FIVE, CERVICAL FIVE-SIX,CERVICAL SIX-SEVEN (N/A Spine Cervical)  Patient Location: PACU  Anesthesia Type:General  Level of Consciousness: drowsy, patient cooperative and responds to stimulation  Airway & Oxygen Therapy: Patient Spontanous Breathing and Patient connected to face mask oxygen, OPA  Post-op Assessment: Report given to RN and Post -op Vital signs reviewed and stable  Post vital signs: Reviewed and stable  Last Vitals:  Vitals Value Taken Time  BP 128/69 03/09/20 1257  Temp 37.1 C 03/09/20 1257  Pulse 103 03/09/20 1302  Resp 17 03/09/20 1302  SpO2 94 % 03/09/20 1302  Vitals shown include unvalidated device data.  Last Pain:  Vitals:   03/09/20 1257  TempSrc:   PainSc: Asleep      Patients Stated Pain Goal: 2 ([id number])  Complications: No apparent anesthesia complications

## 2020-03-09 NOTE — Progress Notes (Signed)
Removed foley cath placed in or/ no issuies upon removal, yellow urine

## 2020-03-10 MED ORDER — CYCLOBENZAPRINE HCL 10 MG PO TABS: 10.0000 mg | ORAL_TABLET | Freq: Three times a day (TID) | ORAL | 0 refills | Status: AC | PRN

## 2020-03-10 MED ORDER — DOCUSATE SODIUM 100 MG PO CAPS: 100.0000 mg | ORAL_CAPSULE | Freq: Two times a day (BID) | ORAL | 0 refills | Status: DC

## 2020-03-10 MED ORDER — OXYCODONE HCL 10 MG PO TABS: 10.0000 mg | ORAL_TABLET | ORAL | 0 refills | Status: DC | PRN

## 2020-03-10 MED FILL — Thrombin For Soln 5000 Unit: CUTANEOUS | Qty: 5000 | Status: AC

## 2020-03-10 NOTE — Discharge Summary (Signed)
Physician Discharge Summary     Providing Compassionate, Quality Care - Together   Patient ID: Tom Wilkerson MRN: [id number] DOB/AGE: 06-25-1966 54 y.o.  Admit date: 03/09/2020 Discharge date: 03/10/2020  Admission Diagnoses: Cervical spondylosis with radiculopathy  Discharge Diagnoses:  Active Problems:   Cervical spondylosis with radiculopathy   Discharged Condition: good  Hospital Course: Patient was admitted to [facility] following C3-4, C4-5, C5-6 and C6-7 anterior cervical discectomy/decompression by Dr. Arnoldo Morale on 03/09/2020. The patient's postoperative course was uncomplicated. He has ambulated in the hall. His pain is well-controlled with oral analgesics. He is ready for discharge home.  Consults: None  Significant Diagnostic Studies: radiology: DG Cervical Spine 2-3 Views  Result Date: 03/09/2020 CLINICAL DATA:  Operative imaging for anterior cervical decompression/discectomy and fusion. EXAM: CERVICAL SPINE - 2-3 VIEW COMPARISON:  01/17/2020. FINDINGS: Two submitted images. Additional image shows placement of a surgical needle with its tip projecting over the C3-C4 disc. Follow-up image shows placement a fixation plate and associated screws spanning C3 through C7. The orthopedic hardware appears well seated. IMPRESSION: Well-positioned orthopedic hardware following anterior cervical fusion, C3 through C7. Electronically Signed   By: Lajean Manes M.D.   On: 03/09/2020 14:29     Treatments: surgery:  C3-4, C4-5, C5-6 and C6-7 anterior cervical discectomy/decompression; C3-4, C4-5, C5-6, C6-7 interbody arthrodesis with local morcellized autograft bone and Zimmer DBM; insertion of interbody prosthesis at C3-4, C4-5, C5-6, C6-7 (Zimmer peek interbody prosthesis); anterior cervical plating from C3-C7 with globus titanium plate   Discharge Exam: Blood pressure 130/79, pulse 87, temperature 97.9 F (36.6 C), temperature source Oral, resp. rate 17, SpO2 94 %.   Alert and oriented x  4 PERRLA CN II-XII grossly intact MAE, Strength and sensation intact Incision is covered with Honeycomb dressing and Steri Strips; JP drain was just removed. There is a small amount of serosanguinous drainage. The nurse applied Steri Strips to the site and applied a new Honey Comb dressing. Site is clean and intact.  Disposition: Discharge disposition: 01-Home or Self Care        Allergies as of 03/10/2020      Reactions   Bee Venom Swelling   Extreme localized swelling    Codeine    Insomnia and Hyperactivity   Ibuprofen Itching, Swelling   Facial swelling   Nsaids Other (See Comments)   Facial swelling   Amlodipine Other (See Comments)   dizziness      Medication List    STOP taking these medications   Sunosi 75 MG Tabs Generic drug: Solriamfetol HCl     TAKE these medications   acetaminophen 500 MG tablet Commonly known as: TYLENOL Take 1,000 mg by mouth every 6 (six) hours as needed for moderate pain or headache.   AMBULATORY NON FORMULARY MEDICATION Patient has OSA or probable OSA (Yes or No): yes Is the patient currently using CPAP within the home? (Yes or No): yes If yes to question 2, what is the current DME provider?  Are there any changes to the order/settings? (if yes, please specify) no If no to question 2, date of sleep study: 10/22/17 Date of face-to-face encounter: 01/13/2018 Settings: auto 5-20 cm h2O Signs and symptoms or probable OSA, mention all that apply (snoring) (morning  Headaches) (witnessed apneas) (choking) (gasping during sleep): snoring, apnea Resmed S/O air/auto with heated humidity.   Enroll in [place] [facility] [employer] CPAP supplies needed: mask of choice, headgear, cushions, filters, climate control tubing and water chamber.   Armodafinil 150 MG tablet  Take 1 tablet (150 mg total) by mouth daily.   ARTIFICIAL TEAR OINTMENT OP Place 1 application into both eyes daily as needed (dry eyes).   buPROPion 150 MG 24 hr  tablet Commonly known as: WELLBUTRIN XL TAKE ONE TABLET BY MOUTH EVERY DAY   CoQ-10 100 MG Caps Take 100 mg by mouth at bedtime.   cyclobenzaprine 10 MG tablet Commonly known as: FLEXERIL Take 1 tablet (10 mg total) by mouth 3 (three) times daily as needed for muscle spasms. What changed: when to take this   diphenhydramine-acetaminophen 25-500 MG Tabs tablet Commonly known as: TYLENOL PM Take 2 tablets by mouth at bedtime as needed (sleep).   docusate sodium 100 MG capsule Commonly known as: COLACE Take 1 capsule (100 mg total) by mouth 2 (two) times daily.   gabapentin 100 MG capsule Commonly known as: NEURONTIN Take 2 capsules (200 mg total) by mouth at bedtime. What changed:   how much to take  when to take this  additional instructions   LORazepam 0.5 MG tablet Commonly known as: ATIVAN One by mouth every six hours if needed for stormy days or sleep What changed:   how much to take  how to take this  when to take this  reasons to take this  additional instructions   omeprazole 40 MG capsule Commonly known as: PRILOSEC Take 40 mg by mouth every evening.   Oxycodone HCl 10 MG Tabs Take 1 tablet (10 mg total) by mouth every 4 (four) hours as needed for severe pain ((score 7 to 10)).   pravastatin 20 MG tablet Commonly known as: PRAVACHOL TAKE 1 TABLET EVERY OTHER DAY AT BEDTIME What changed: See the new instructions.   PROBIOTIC PO Take 1 capsule by mouth daily.   tamsulosin 0.4 MG Caps capsule Commonly known as: FLOMAX Take 0.4 mg by mouth daily as needed (urinary flow issues).   Tart Cherry Advanced Caps Take 2 capsules by mouth every evening.   testosterone cypionate 200 MG/ML injection Commonly known as: DEPOTESTOSTERONE CYPIONATE Inject 140 mg into the muscle every Tuesday. At night   TURMERIC PO Take 1 tablet by mouth every evening.   valsartan 320 MG tablet Commonly known as: DIOVAN Take 320 mg by mouth at bedtime.   XLEAR SINUS  CARE SPRAY NA Place 1 spray into the nose daily as needed (allergies).      Follow-up Information    Newman Pies, MD. Schedule an appointment as soon as possible for a visit in 2 week(s).   Specialty: Neurosurgery Contact information: [address] [place] [address] [phone]           Signed: Patricia Nettle 03/10/2020, 9:33 AM

## 2020-03-10 NOTE — Evaluation (Signed)
Occupational Therapy Evaluation Patient Details Name: Tom Wilkerson MRN: [id number] DOB: Sep 29, 1966 Today's Date: 03/10/2020    History of Present Illness 54 yo male s/p ACDF C3-4 4-5 5-6 6-7 PMH DDD HTn HLD HOH Cdiff with fecal transplant, OSA   Clinical Impression   Patient is s/p ACDF surgery resulting in functional limitations due to the deficits listed below (see OT problem list). Pt reports R hand numbness at the thumb mainly with minimal 2nd digit. Pt is able to don doff brace with wife (A) mod I. Pt plans to sleep in recliner for positioning.  Patient will benefit from skilled OT acutely to increase independence and safety with ADLS to allow discharge home..    Follow Up Recommendations  No OT follow up    Equipment Recommendations  None recommended by OT    Recommendations for Other Services       Precautions / Restrictions Precautions Precautions: Cervical Precaution Comments: handout provided for adls with cervical precations. cautioned to reach 90 degres to avoid neck extension Required Braces or Orthoses: Cervical Brace Cervical Brace: Hard collar;At all times(order states can doff in bed)      Mobility Bed Mobility Overal bed mobility: Modified Independent                Transfers Overall transfer level: Modified independent                    Balance                                           ADL either performed or assessed with clinical judgement   ADL Overall ADL's : Needs assistance/impaired Eating/Feeding: Modified independent   Grooming: Wash/dry face   Upper Body Bathing: Modified independent   Lower Body Bathing: Min guard   Upper Body Dressing : Modified independent       Toilet Transfer: Supervision/safety           Functional mobility during ADLs: Supervision/safety General ADL Comments: pt and wife don doff cervical brace. educated on how to change pads and wash them at home. pt noted to have  drainage from front of dressing and Rn called to room to address the drainage     Vision Baseline Vision/History: Wears glasses Wears Glasses: At all times       Perception     Praxis      Pertinent Vitals/Pain Pain Assessment: No/denies pain     Hand Dominance Right   Extremity/Trunk Assessment Upper Extremity Assessment Upper Extremity Assessment: RUE deficits/detail RUE Deficits / Details: pt has numbness throught thumb  and partially 2nd digit. pt denies any numbness in remain digits. pt demonstrates decrease proprioception with thumb when attempting to text. pt advised on exercises and task in the home to help with R hand strength. wife states she has some playdoh that she can give hime.  RUE Sensation: decreased proprioception   Lower Extremity Assessment Lower Extremity Assessment: Overall WFL for tasks assessed   Cervical / Trunk Assessment Cervical / Trunk Assessment: Other exceptions(s/p surg)   Communication Communication Communication: HOH   Cognition Arousal/Alertness: Awake/alert(a) Behavior During Therapy: WFL for tasks assessed/performed Overall Cognitive Status: Within Functional Limits for tasks assessed  General Comments  dressing is red with active drainage. Rn in room to address    Exercises     Shoulder [place] expects to be discharged to:: Private residence Living Arrangements: Spouse/significant other Available Help at Discharge: Family Type of Home: House Home Access: Stairs to enter [employer] of Steps: 7 Entrance Stairs-Rails: Right;Left Home Layout: Multi-level;Able to live on main level with bedroom/bathroom;Bed/bath upstairs Alternate Level Stairs-Number of Steps: 13   Bathroom Shower/Tub: Occupational psychologist: Handicapped height     Home Equipment: None   Additional Comments: reports sleeping in recliner for weeks  at this time to avoid stairs to bedroom. pt has 2 yo dog in the home.       Prior Functioning/Environment Level of Independence: Independent        Comments: pt reports "he said i could drive. " pt advised that patient's normally wait for follow up to drive and to continue to speak with MD about appropriateness of driving        OT Problem List:        OT Treatment/Interventions:      OT Goals(Current goals can be found in the care plan section) Acute Rehab OT Goals Patient Stated Goal: to go home  OT Frequency:     Barriers to D/C:            Co-evaluation              AM-PAC OT "6 Clicks" Daily Activity     Outcome Measure Help from another person eating meals?: None Help from another person taking care of personal grooming?: None Help from another person toileting, which includes using toliet, bedpan, or urinal?: None Help from another person bathing (including washing, rinsing, drying)?: None Help from another person to put on and taking off regular upper body clothing?: None Help from another person to put on and taking off regular lower body clothing?: None 6 Click Score: 24   End of Session Equipment Utilized During Treatment: Cervical collar Nurse Communication: Mobility status;Precautions  Activity Tolerance: Patient tolerated treatment well Patient left: in bed;with nursing/sitter in room;with family/visitor present  OT Visit Diagnosis: Muscle weakness (generalized) (M62.81)                Time: [id number] OT Time Calculation (min): 49 min Charges:  OT General Charges $OT Visit: 1 Visit OT Treatments $Self Care/Home Management : 23-37 mins   Brynn, OTR/L  Acute Rehabilitation Services Pager: [phone] Office: [phone] .   Jeri Modena 03/10/2020, 9:33 AM

## 2020-03-10 NOTE — Discharge Instructions (Addendum)
Wound Care Keep incision covered and dry for three days.    Do not put any creams, lotions, or ointments on incision. Leave steri-strips on back.  They will fall off by themselves. Activity Walk each and every day, increasing distance each day. No lifting greater than 5 lbs.  Avoid excessive neck motion. No driving for 2 weeks; may ride as a passenger locally.  Diet Resume your normal diet.  Return to Work Will be discussed at your follow up appointment. Call Your Doctor If Any of These Occur Redness, drainage, or swelling at the wound.  Temperature greater than 101 degrees. Severe pain not relieved by pain medication. Incision starts to come apart. Follow Up Appt Call today for appointment in 2-3 weeks [phone]) or for problems.  If you have any hardware placed in your spine, you will need an x-ray before your appointment.

## 2020-03-10 NOTE — Plan of Care (Signed)
Patient alert and oriented, mae's well, voiding adequate amount of urine, swallowing without difficulty, no c/o pain at time of discharge. Patient discharged home with family. Script and discharged instructions given to patient. Patient and family stated understanding of instructions given. Patient has an appointment with Dr. Jenkins   

## 2020-03-14 ENCOUNTER — Other Ambulatory Visit (HOSPITAL_COMMUNITY): Payer: Self-pay | Admitting: Student

## 2020-03-14 ENCOUNTER — Ambulatory Visit (HOSPITAL_COMMUNITY)
Admission: RE | Admit: 2020-03-14 | Discharge: 2020-03-14 | Disposition: A | Discharge status: Home / Self Care | Payer: 59 | Source: Ambulatory Visit | Attending: Student | Admitting: Student

## 2020-03-14 ENCOUNTER — Inpatient Hospital Stay (HOSPITAL_COMMUNITY)
Admission: EM | Admit: 2020-03-14 | Discharge: 2020-03-17 | DRG: 301 | Disposition: A | Discharge status: Home / Self Care | Payer: 59 | Attending: Internal Medicine | Admitting: Internal Medicine

## 2020-03-14 ENCOUNTER — Encounter (HOSPITAL_COMMUNITY): Payer: Self-pay | Admitting: Emergency Medicine

## 2020-03-14 ENCOUNTER — Other Ambulatory Visit: Payer: Self-pay

## 2020-03-14 DIAGNOSIS — N183 Chronic kidney disease, stage 3 unspecified: Secondary | ICD-10-CM | POA: Diagnosis present

## 2020-03-14 DIAGNOSIS — E785 Hyperlipidemia, unspecified: Secondary | ICD-10-CM | POA: Diagnosis present

## 2020-03-14 DIAGNOSIS — M47812 Spondylosis without myelopathy or radiculopathy, cervical region: Secondary | ICD-10-CM | POA: Diagnosis present

## 2020-03-14 DIAGNOSIS — N182 Chronic kidney disease, stage 2 (mild): Secondary | ICD-10-CM | POA: Diagnosis not present

## 2020-03-14 DIAGNOSIS — I82401 Acute embolism and thrombosis of unspecified deep veins of right lower extremity: Secondary | ICD-10-CM | POA: Diagnosis present

## 2020-03-14 DIAGNOSIS — Z91030 Bee allergy status: Secondary | ICD-10-CM

## 2020-03-14 DIAGNOSIS — Z881 Allergy status to other antibiotic agents status: Secondary | ICD-10-CM

## 2020-03-14 DIAGNOSIS — Z9889 Other specified postprocedural states: Secondary | ICD-10-CM

## 2020-03-14 DIAGNOSIS — Z886 Allergy status to analgesic agent status: Secondary | ICD-10-CM

## 2020-03-14 DIAGNOSIS — Z83438 Family history of other disorder of lipoprotein metabolism and other lipidemia: Secondary | ICD-10-CM

## 2020-03-14 DIAGNOSIS — I824Y1 Acute embolism and thrombosis of unspecified deep veins of right proximal lower extremity: Secondary | ICD-10-CM

## 2020-03-14 DIAGNOSIS — N411 Chronic prostatitis: Secondary | ICD-10-CM | POA: Diagnosis present

## 2020-03-14 DIAGNOSIS — I82461 Acute embolism and thrombosis of right calf muscular vein: Secondary | ICD-10-CM | POA: Diagnosis not present

## 2020-03-14 DIAGNOSIS — Z79899 Other long term (current) drug therapy: Secondary | ICD-10-CM

## 2020-03-14 DIAGNOSIS — N400 Enlarged prostate without lower urinary tract symptoms: Secondary | ICD-10-CM | POA: Diagnosis present

## 2020-03-14 DIAGNOSIS — Z20822 Contact with and (suspected) exposure to covid-19: Secondary | ICD-10-CM | POA: Diagnosis present

## 2020-03-14 DIAGNOSIS — Z8619 Personal history of other infectious and parasitic diseases: Secondary | ICD-10-CM

## 2020-03-14 DIAGNOSIS — Z8261 Family history of arthritis: Secondary | ICD-10-CM

## 2020-03-14 DIAGNOSIS — M19011 Primary osteoarthritis, right shoulder: Secondary | ICD-10-CM | POA: Diagnosis present

## 2020-03-14 DIAGNOSIS — Z9989 Dependence on other enabling machines and devices: Secondary | ICD-10-CM

## 2020-03-14 DIAGNOSIS — Z9289 Personal history of other medical treatment: Secondary | ICD-10-CM

## 2020-03-14 DIAGNOSIS — Z885 Allergy status to narcotic agent status: Secondary | ICD-10-CM

## 2020-03-14 DIAGNOSIS — R1310 Dysphagia, unspecified: Secondary | ICD-10-CM | POA: Diagnosis present

## 2020-03-14 DIAGNOSIS — M79661 Pain in right lower leg: Secondary | ICD-10-CM | POA: Diagnosis not present

## 2020-03-14 DIAGNOSIS — J309 Allergic rhinitis, unspecified: Secondary | ICD-10-CM | POA: Diagnosis present

## 2020-03-14 DIAGNOSIS — R52 Pain, unspecified: Secondary | ICD-10-CM

## 2020-03-14 DIAGNOSIS — G4733 Obstructive sleep apnea (adult) (pediatric): Secondary | ICD-10-CM | POA: Diagnosis present

## 2020-03-14 DIAGNOSIS — I129 Hypertensive chronic kidney disease with stage 1 through stage 4 chronic kidney disease, or unspecified chronic kidney disease: Secondary | ICD-10-CM | POA: Diagnosis present

## 2020-03-14 DIAGNOSIS — I82409 Acute embolism and thrombosis of unspecified deep veins of unspecified lower extremity: Secondary | ICD-10-CM | POA: Diagnosis present

## 2020-03-14 DIAGNOSIS — Z888 Allergy status to other drugs, medicaments and biological substances status: Secondary | ICD-10-CM

## 2020-03-14 DIAGNOSIS — E291 Testicular hypofunction: Secondary | ICD-10-CM | POA: Diagnosis present

## 2020-03-14 DIAGNOSIS — Z8249 Family history of ischemic heart disease and other diseases of the circulatory system: Secondary | ICD-10-CM

## 2020-03-14 DIAGNOSIS — Z79890 Hormone replacement therapy (postmenopausal): Secondary | ICD-10-CM

## 2020-03-14 DIAGNOSIS — H9190 Unspecified hearing loss, unspecified ear: Secondary | ICD-10-CM | POA: Diagnosis present

## 2020-03-14 LAB — BASIC METABOLIC PANEL
Anion gap: 7 (ref 5–15)
BUN: 13 mg/dL (ref 6–20)
CO2: 32 mmol/L (ref 22–32)
Calcium: 9.4 mg/dL (ref 8.9–10.3)
Chloride: 102 mmol/L (ref 98–111)
Creatinine, Ser: 1.31 mg/dL — ABNORMAL HIGH (ref 0.61–1.24)
GFR calc Af Amer: >60 mL/min (ref >60)
GFR calc non Af Amer: >60 mL/min (ref >60)
Glucose, Bld: 112 mg/dL — ABNORMAL HIGH (ref 70–99)
Potassium: 4.6 mmol/L (ref 3.5–5.1)
Sodium: 141 mmol/L (ref 135–145)

## 2020-03-14 LAB — HIV ANTIBODY (ROUTINE TESTING W REFLEX): HIV Screen 4th Generation wRfx: NONREACTIVE

## 2020-03-14 LAB — CBC
HCT: 46.9 % (ref 39.0–52.0)
Hemoglobin: 14.6 g/dL (ref 13.0–17.0)
MCH: 25.6 pg — ABNORMAL LOW (ref 26.0–34.0)
MCHC: 31.1 g/dL (ref 30.0–36.0)
MCV: 82.1 fL (ref 80.0–100.0)
Platelets: 246 10*3/uL (ref 150–400)
RBC: 5.71 MIL/uL (ref 4.22–5.81)
RDW: 16.8 % — ABNORMAL HIGH (ref 11.5–15.5)
WBC: 9.5 10*3/uL (ref 4.0–10.5)
nRBC: 0 % (ref 0.0–0.2)

## 2020-03-14 LAB — PROTIME-INR
INR: 1.1 (ref 0.8–1.2)
Prothrombin Time: 13.6 seconds (ref 11.4–15.2)

## 2020-03-14 LAB — RESPIRATORY PANEL BY RT PCR (FLU A&B, COVID)
Influenza A by PCR: NEGATIVE
Influenza B by PCR: NEGATIVE
SARS Coronavirus 2 by RT PCR: NEGATIVE

## 2020-03-14 MED ORDER — OXYCODONE HCL 5 MG PO TABS
10.0000 mg | ORAL_TABLET | ORAL | Status: DC | PRN
  Administered 2020-03-15 (×4): 10 mg via ORAL
  Filled 2020-03-14 (×5): qty 2

## 2020-03-14 MED ORDER — CYCLOBENZAPRINE HCL 10 MG PO TABS
10.0000 mg | ORAL_TABLET | Freq: Three times a day (TID) | ORAL | Status: DC | PRN
  Administered 2020-03-15 – 2020-03-16 (×2): 10 mg via ORAL
  Filled 2020-03-14 (×2): qty 1

## 2020-03-14 MED ORDER — DOCUSATE SODIUM 100 MG PO CAPS
100.0000 mg | ORAL_CAPSULE | Freq: Two times a day (BID) | ORAL | Status: DC
  Administered 2020-03-14 – 2020-03-17 (×5): 100 mg via ORAL
  Filled 2020-03-14 (×6): qty 1

## 2020-03-14 MED ORDER — PRAVASTATIN SODIUM 10 MG PO TABS
20.0000 mg | ORAL_TABLET | Freq: Every day | ORAL | Status: DC
  Administered 2020-03-14 – 2020-03-16 (×3): 20 mg via ORAL
  Filled 2020-03-14 (×3): qty 2

## 2020-03-14 MED ORDER — GABAPENTIN 100 MG PO CAPS
200.0000 mg | ORAL_CAPSULE | Freq: Three times a day (TID) | ORAL | Status: DC
  Administered 2020-03-14 – 2020-03-17 (×8): 200 mg via ORAL
  Filled 2020-03-14 (×8): qty 2

## 2020-03-14 MED ORDER — PANTOPRAZOLE SODIUM 40 MG PO TBEC
40.0000 mg | DELAYED_RELEASE_TABLET | Freq: Every day | ORAL | Status: DC
  Administered 2020-03-15 – 2020-03-17 (×3): 40 mg via ORAL
  Filled 2020-03-14 (×3): qty 1

## 2020-03-14 MED ORDER — ONDANSETRON HCL 4 MG/2ML IJ SOLN
4.0000 mg | Freq: Once | INTRAMUSCULAR | Status: AC
  Administered 2020-03-14: 4 mg via INTRAVENOUS
  Filled 2020-03-14: qty 2

## 2020-03-14 MED ORDER — LORAZEPAM 0.5 MG PO TABS
0.5000 mg | ORAL_TABLET | Freq: Four times a day (QID) | ORAL | Status: DC | PRN
  Administered 2020-03-15 – 2020-03-16 (×2): 0.5 mg via ORAL
  Filled 2020-03-14 (×2): qty 1

## 2020-03-14 MED ORDER — LORAZEPAM 1 MG PO TABS: 0.5000 mg | ORAL_TABLET | Freq: Once | ORAL | Status: DC

## 2020-03-14 MED ORDER — BUPROPION HCL ER (XL) 150 MG PO TB24
150.0000 mg | ORAL_TABLET | Freq: Every morning | ORAL | Status: DC
  Administered 2020-03-15 – 2020-03-17 (×3): 150 mg via ORAL
  Filled 2020-03-14 (×3): qty 1

## 2020-03-14 MED ORDER — LORAZEPAM 1 MG PO TABS
0.5000 mg | ORAL_TABLET | Freq: Once | ORAL | Status: AC
  Administered 2020-03-14: 20:00:00 0.5 mg via ORAL
  Filled 2020-03-14: qty 1

## 2020-03-14 MED ORDER — ACETAMINOPHEN 325 MG PO TABS: 650.0000 mg | ORAL_TABLET | Freq: Four times a day (QID) | ORAL | Status: DC | PRN

## 2020-03-14 MED ORDER — TAMSULOSIN HCL 0.4 MG PO CAPS
0.4000 mg | ORAL_CAPSULE | Freq: Every day | ORAL | Status: DC
  Administered 2020-03-16 – 2020-03-17 (×2): 0.4 mg via ORAL
  Filled 2020-03-14 (×3): qty 1

## 2020-03-14 MED ORDER — IRBESARTAN 300 MG PO TABS
300.0000 mg | ORAL_TABLET | Freq: Every day | ORAL | Status: DC
  Administered 2020-03-14 – 2020-03-17 (×4): 300 mg via ORAL
  Filled 2020-03-14 (×4): qty 1

## 2020-03-14 MED ORDER — HEPARIN (PORCINE) 25000 UT/250ML-% IV SOLN
1650.0000 [IU]/h | INTRAVENOUS | Status: AC
  Administered 2020-03-14: 1600 [IU]/h via INTRAVENOUS
  Administered 2020-03-15 – 2020-03-16 (×2): 1750 [IU]/h via INTRAVENOUS
  Administered 2020-03-16: 17:00:00 1650 [IU]/h via INTRAVENOUS
  Filled 2020-03-14 (×4): qty 250

## 2020-03-14 MED ORDER — OXYCODONE HCL 5 MG PO TABS
10.0000 mg | ORAL_TABLET | Freq: Once | ORAL | Status: AC
  Administered 2020-03-14: 20:00:00 10 mg via ORAL
  Filled 2020-03-14: qty 2

## 2020-03-14 MED ORDER — OXYCODONE-ACETAMINOPHEN 5-325 MG PO TABS
2.0000 | ORAL_TABLET | Freq: Once | ORAL | Status: DC
  Filled 2020-03-14: qty 2

## 2020-03-14 NOTE — ED Provider Notes (Signed)
[facility] EMERGENCY DEPARTMENT Provider Note   CSN: [id number] Arrival date & time: 03/14/20  1635     History Chief Complaint  Patient presents with  . dvt    Tom Wilkerson is a 54 y.o. male.  He had a recent multilevel fusion cervical by Dr. Arnoldo Morale 4 days ago.  He still has some residual paresthesias in his right hand and has a significant amount of neck discomfort and pain with swallowing.  He went to postop appointment today and mention that he also had some pain in his right calf.  They sent him for an ultrasound and he had a DVT.  They recommended he come to the emergency department for admission.  Denies any fevers chills chest pain shortness of breath.  No prior history of DVT.  The history is provided by the patient.  Leg Pain Location:  Leg Leg location:  R lower leg Pain details:    Quality:  Aching   Radiates to:  Does not radiate   Severity:  Moderate   Onset quality:  Gradual   Timing:  Constant   Progression:  Unchanged Chronicity:  New Dislocation: no   Relieved by:  Nothing Worsened by:  Bearing weight Ineffective treatments:  None tried Associated symptoms: neck pain   Associated symptoms: no back pain, no decreased ROM, no fever and no swelling        Past Medical History:  Diagnosis Date  . Allergic rhinitis, cause unspecified   . Arthritis of neck   . Arthritis of shoulder    "right shoulder blade" per patient  . Chest pain, unspecified   . Chicken pox   . Chronic prostatitis   . Clostridium difficile colitis    following course of amoxicillin 10/2012 then Cefdinir 10/2013; recurrent C. difficile after Flagyl and intolerant to Vancoycin; s/p Dificid followed by fecal transplant 10/28/13  . Elevated blood pressure reading without diagnosis of hypertension   . Esophageal reflux   . Hypertension   . Insomnia, unspecified   . Irritability   . Nonspecific abnormal electrocardiogram (ECG) (EKG)   . Other alopecia   . Other  and unspecified hyperlipidemia   . Other testicular hypofunction   . Sleep apnea    03/07/2020: "about 3-4 years ago, wears CPAP every night"  . Sleep related leg cramps   . Unspecified contusion of eye   . Unspecified hearing loss   . Unspecified hyperplasia of prostate without urinary obstruction and other lower urinary tract symptoms (LUTS)     Patient Active Problem List   Diagnosis Date Noted  . Cervical spondylosis with radiculopathy 03/09/2020  . Cervicalgia 02/21/2020  . DDD (degenerative disc disease), cervical 02/21/2020  . Rotator cuff tendinitis, right 01/21/2020  . AC (acromioclavicular) arthritis 01/21/2020  . OSA on CPAP 03/27/2018  . Elevated hemoglobin ([facility]) 10/18/2015  . Impaired fasting glucose 06/13/2015  . Dyslipidemia 06/02/2015  . Medication monitoring encounter 06/02/2015  . CKD (chronic kidney disease) stage 2, GFR 60-89 ml/min 06/02/2015  . Insomnia 08/19/2012  . Anxiety 08/19/2012  . Elevated blood pressure, situational 08/19/2012  . BPH (benign prostatic hyperplasia) 08/19/2012  . Hypogonadism male 08/19/2012  . Family history of prostate cancer 08/19/2012    Past Surgical History:  Procedure Laterality Date  . DENTAL RESTORATION/EXTRACTION WITH X-RAY    . TONSILLECTOMY  01/31/2009   [name]       Family History  Problem Relation Age of Onset  . Hypertension Mother   . Lung cancer  Mother   . Depression Mother   . Hyperlipidemia Mother   . Arthritis Mother   . Kidney cancer Mother   . Cancer Mother        kidney, lung  . CAD Father        CABG age 21  . Hypertension Father   . Stroke Father   . Hyperlipidemia Father   . Prostate cancer Father   . Congestive Heart Failure Father   . Cancer Father        prostate  . Cancer Other        malignancy prostate  . Migraines Sister   . Liver disease Brother        on liver transplant list  . Ulcerative colitis Brother   . COPD Neg Hx   . Diabetes Neg Hx   . Heart disease Neg Hx      Social History   Tobacco Use  . Smoking status: Never Smoker  . Smokeless tobacco: Never Used  Substance Use Topics  . Alcohol use: Yes    Comment: occasional socially twice weekly beer  . Drug use: No    Home Medications Prior to Admission medications   Medication Sig Start Date End Date Taking? Authorizing Provider  acetaminophen (TYLENOL) 500 MG tablet Take 1,000 mg by mouth every 6 (six) hours as needed for moderate pain or headache.    [provider]  AMBULATORY NON FORMULARY MEDICATION Patient has OSA or probable OSA (Yes or No): yes Is the patient currently using CPAP within the home? (Yes or No): yes If yes to question 2, what is the current DME provider?  Are there any changes to the order/settings? (if yes, please specify) no If no to question 2, date of sleep study: 10/22/17 Date of face-to-face encounter: 01/13/2018 Settings: auto 5-20 cm h2O Signs and symptoms or probable OSA, mention all that apply (snoring) (morning  Headaches) (witnessed apneas) (choking) (gasping during sleep): snoring, apnea Resmed S/O air/auto with heated humidity.   Enroll in [facility] [place] [facility] [employer] CPAP supplies needed: mask of choice, headgear, cushions, filters, climate control tubing and water chamber. 08/24/18   Laverle Hobby, MD  Armodafinil 150 MG tablet Take 1 tablet (150 mg total) by mouth daily. Patient not taking: Reported on 02/29/2020 12/27/19   Laurin Coder, MD  ARTIFICIAL TEAR OINTMENT OP Place 1 application into both eyes daily as needed (dry eyes).    [provider]  buPROPion (WELLBUTRIN XL) 150 MG 24 hr tablet TAKE ONE TABLET BY MOUTH EVERY DAY Patient taking differently: Take 150 mg by mouth daily.  05/14/18   Arnetha Courser, MD  Coenzyme Q10 (COQ-10) 100 MG CAPS Take 100 mg by mouth at bedtime.    [provider]  cyclobenzaprine (FLEXERIL) 10 MG tablet Take 1 tablet (10 mg total) by mouth 3 (three) times daily as needed for  muscle spasms. 03/10/20   Viona Gilmore D, NP  diphenhydramine-acetaminophen (TYLENOL PM) 25-500 MG TABS tablet Take 2 tablets by mouth at bedtime as needed (sleep).    [provider]  docusate sodium (COLACE) 100 MG capsule Take 1 capsule (100 mg total) by mouth 2 (two) times daily. 03/10/20   Viona Gilmore D, NP  gabapentin (NEURONTIN) 100 MG capsule Take 2 capsules (200 mg total) by mouth at bedtime. Patient taking differently: Take 200-300 mg by mouth See admin instructions. Take 200 mg in the morning, 200 mg in the afternoon, and 200-300 mg at bedtime 01/21/20  Hulan Saas M, DO  LORazepam (ATIVAN) 0.5 MG tablet One by mouth every six hours if needed for stormy days or sleep Patient taking differently: Take 0.5 mg by mouth daily as needed for anxiety.  10/02/18   Arnetha Courser, MD  Misc Natural Products (TART CHERRY ADVANCED) CAPS Take 2 capsules by mouth every evening.    [provider]  omeprazole (PRILOSEC) 40 MG capsule Take 40 mg by mouth every evening. 02/07/20   [provider]  oxyCODONE 10 MG TABS Take 1 tablet (10 mg total) by mouth every 4 (four) hours as needed for severe pain ((score 7 to 10)). 03/10/20   Viona Gilmore D, NP  pravastatin (PRAVACHOL) 20 MG tablet TAKE 1 TABLET EVERY OTHER DAY AT BEDTIME Patient taking differently: Take 20 mg by mouth at bedtime.  04/17/19   Hubbard Hartshorn, FNP  Probiotic Product (PROBIOTIC PO) Take 1 capsule by mouth daily.     [provider]  Sodium Chloride-Xylitol (XLEAR SINUS CARE SPRAY NA) Place 1 spray into the nose daily as needed (allergies).    [provider]  tamsulosin (FLOMAX) 0.4 MG CAPS capsule Take 0.4 mg by mouth daily as needed (urinary flow issues).    [provider]  testosterone cypionate (DEPOTESTOSTERONE CYPIONATE) 200 MG/ML injection Inject 140 mg into the muscle every Tuesday. At night    [provider]  TURMERIC PO Take 1 tablet by mouth every evening.     [provider]  valsartan (DIOVAN) 320 MG tablet Take 320 mg by mouth at bedtime.    [provider]    Allergies    Bee venom, Codeine, Ibuprofen, Nsaids, and Amlodipine  Review of Systems   Review of Systems  Constitutional: Negative for fever.  HENT: Positive for trouble swallowing. Negative for sore throat.   Eyes: Negative for visual disturbance.  Respiratory: Negative for shortness of breath.   Cardiovascular: Negative for chest pain.  Gastrointestinal: Negative for abdominal pain.  Genitourinary: Negative for dysuria.  Musculoskeletal: Positive for neck pain. Negative for back pain.  Skin: Negative for rash.  Neurological: Negative for weakness.    Physical Exam Updated Vital Signs BP (!) 161/99 (BP Location: Left Arm)   Pulse 88   Temp 98.9 F (37.2 C) (Oral)   Resp 18   Ht 6' (1.829 m)   Wt 108 kg   SpO2 100%   BMI 32.28 kg/m   Physical Exam Vitals and nursing note reviewed.  Constitutional:      Appearance: He is well-developed.  HENT:     Head: Normocephalic and atraumatic.  Eyes:     Conjunctiva/sclera: Conjunctivae normal.  Neck:     Comments: He is in a hard cervical collar.  Normal phonation Cardiovascular:     Rate and Rhythm: Normal rate and regular rhythm.     Heart sounds: No murmur.  Pulmonary:     Effort: Pulmonary effort is normal. No respiratory distress.     Breath sounds: Normal breath sounds.  Abdominal:     Palpations: Abdomen is soft.     Tenderness: There is no abdominal tenderness.  Musculoskeletal:        General: Tenderness present. Normal range of motion.     Comments: Has some tenderness in the right calf approximately no significant edema no cords appreciated  Skin:    General: Skin is warm and dry.     Capillary Refill: Capillary refill takes less than 2 seconds.  Neurological:  General: No focal deficit present.     Mental Status: He is alert.     Gait: Gait normal.     ED Results /  Procedures / Treatments   Labs (all labs ordered are listed, but only abnormal results are displayed) Labs Reviewed  CBC - Abnormal; Notable for the following components:      Result Value   MCH 25.6 (*)    RDW 16.8 (*)    All other components within normal limits  BASIC METABOLIC PANEL - Abnormal; Notable for the following components:   Glucose, Bld 112 (*)    Creatinine, Ser 1.31 (*)    All other components within normal limits  HEPARIN LEVEL (UNFRACTIONATED) - Abnormal; Notable for the following components:   Heparin Unfractionated 0.25 (*)    All other components within normal limits  CBC - Abnormal; Notable for the following components:   MCH 25.4 (*)    RDW 17.2 (*)    All other components within normal limits  BASIC METABOLIC PANEL - Abnormal; Notable for the following components:   Glucose, Bld 108 (*)    All other components within normal limits  RESPIRATORY PANEL BY RT PCR (FLU A&B, COVID)  PROTIME-INR  HIV ANTIBODY (ROUTINE TESTING W REFLEX)  HEPARIN LEVEL (UNFRACTIONATED)    EKG None  Radiology VAS [place] LOWER EXTREMITY VENOUS (DVT)  Result Date: 03/14/2020  Lower Venous DVTStudy Indications: Pain, and Recent cervical fusion.  Comparison Study: No prior study Performing Technologist: Maudry Mayhew MHA, RDMS, RVT, RDCS  Examination Guidelines: A complete evaluation includes B-mode imaging, spectral Doppler, color Doppler, and power Doppler as needed of all accessible portions of each vessel. Bilateral testing is considered an integral part of a complete examination. Limited examinations for reoccurring indications may be performed as noted. The reflux portion of the exam is performed with the patient in reverse Trendelenburg.  +---------+---------------+---------+-----------+----------+--------------+ RIGHT    CompressibilityPhasicitySpontaneityPropertiesThrombus Aging +---------+---------------+---------+-----------+----------+--------------+ CFV      Full            Yes      Yes                                 +---------+---------------+---------+-----------+----------+--------------+ SFJ      Full                                                        +---------+---------------+---------+-----------+----------+--------------+ FV Prox  Full                                                        +---------+---------------+---------+-----------+----------+--------------+ FV Mid   Full                                                        +---------+---------------+---------+-----------+----------+--------------+ FV DistalFull                                                        +---------+---------------+---------+-----------+----------+--------------+  PFV      Full                                                        +---------+---------------+---------+-----------+----------+--------------+ POP      Full           Yes      Yes                                 +---------+---------------+---------+-----------+----------+--------------+ PTV      Full                                                        +---------+---------------+---------+-----------+----------+--------------+ PERO     Full                                                        +---------+---------------+---------+-----------+----------+--------------+ Gastroc  None                    No                   Acute          +---------+---------------+---------+-----------+----------+--------------+   +----+---------------+---------+-----------+----------+--------------+ LEFTCompressibilityPhasicitySpontaneityPropertiesThrombus Aging +----+---------------+---------+-----------+----------+--------------+ CFV Full           Yes      Yes                                 +----+---------------+---------+-----------+----------+--------------+     Summary: RIGHT: - Findings consistent with acute deep vein thrombosis involving the  right gastrocnemius veins. - No cystic structure found in the popliteal fossa.  LEFT: - No evidence of common femoral vein obstruction.  *See table(s) above for measurements and observations.    Preliminary     Procedures Procedures (including critical care time)  Medications Ordered in ED Medications  heparin ADULT infusion 100 units/mL (25000 units/234mL sodium chloride 0.45%) (1,750 Units/hr Intravenous Rate/Dose Change 03/15/20 0605)  acetaminophen (TYLENOL) tablet 650 mg (has no administration in time range)  oxyCODONE (Oxy IR/ROXICODONE) immediate release tablet 10 mg (10 mg Oral Given 03/15/20 0033)  pravastatin (PRAVACHOL) tablet 20 mg (20 mg Oral Given 03/14/20 2247)  irbesartan (AVAPRO) tablet 300 mg (300 mg Oral Given 03/14/20 2246)  buPROPion (WELLBUTRIN XL) 24 hr tablet 150 mg (150 mg Oral Given 03/15/20 0605)  LORazepam (ATIVAN) tablet 0.5 mg (has no administration in time range)  docusate sodium (COLACE) capsule 100 mg (100 mg Oral Given 03/14/20 2247)  pantoprazole (PROTONIX) EC tablet 40 mg (40 mg Oral Not Given 03/15/20 0028)  tamsulosin (FLOMAX) capsule 0.4 mg (0.4 mg Oral Not Given 03/15/20 0028)  cyclobenzaprine (FLEXERIL) tablet 10 mg (has no administration in time range)  gabapentin (NEURONTIN) capsule 200 mg (200 mg Oral Given 03/14/20 2247)  dexamethasone (DECADRON) injection 4 mg (has no administration in time range)  oxyCODONE (Oxy IR/ROXICODONE) immediate release  tablet 10 mg (10 mg Oral Given 03/14/20 2028)  LORazepam (ATIVAN) tablet 0.5 mg (0.5 mg Oral Given 03/14/20 2029)  ondansetron (ZOFRAN) injection 4 mg (4 mg Intravenous Given 03/14/20 2103)    ED Course  I have reviewed the triage vital signs and the nursing notes.  Pertinent labs & imaging results that were available during my care of the patient were reviewed by me and considered in my medical decision making (see chart for details).  Clinical Course as of Mar 15 1032  Tue Mar 14, 2020  1849 Discussed with  [place] [facility] nurse practitioner Jinny Blossom who said Dr. Arnoldo Morale wants the patient on a heparin drip no bolus and admitted to the medical team.  They will round on the patient tomorrow.  I explained that to the patient and his wife.   [MB]    Clinical Course User Index [MB] Hayden Rasmussen, MD   MDM Rules/Calculators/A&P                       Final Clinical Impression(s) / ED Diagnoses Final diagnoses:  Acute deep vein thrombosis (DVT) of proximal vein of right lower extremity [facility])    Rx / DC Orders ED Discharge Orders    None       Hayden Rasmussen, MD 03/15/20 1034

## 2020-03-14 NOTE — ED Triage Notes (Signed)
Pt sent from [facility] with positive DVT in right leg. Pt was there for wound check from recent spinal fusion when he reported her had calf burning. Sent for admission and heparin.

## 2020-03-14 NOTE — ED Provider Notes (Signed)
  5:05 PM triage RN stating patient is requesting dose of his prescribed pain medication, 10 mg oxycodone.  There is ready access to Percocet while the patient is in triage.  I asked the patient about his allergies and past medical history.  He has no difficulties with Tylenol.  I ordered 2 Percocet for him.  5:56 PM patient now telling RN the Percocet pills are too big for him to swallow.  Now requesting dose of home Ativan instead.   Lorayne Bender, PA-C 03/14/20 2216    Margette Fast, MD 03/15/20 [phone]

## 2020-03-14 NOTE — H&P (Signed)
History and Physical    Tom Wilkerson [id number] DOB: [birth year] DOA: 03/14/2020  PCP: [name], MD  Patient coming from: Home, wife at bedside  I have personally briefly reviewed patient's old medical records in [place]  Chief Complaint: Right calf pain/DVT  HPI: Tom Wilkerson is a 54 y.o. male with medical history significant for hypertension, OSA on CPAP, CKD stage III, hyperlipidemia who presents right calf pain and findings of DVT.  Patient underwent cervical anterior discectomy/decompression on 4/23 by neurosurgereon Dr. Arnoldo Morale.  However yesterday evening he began to note right calf cramping.  Thought it was because he needed to walk it off but continued to feel worsening pain today with walking.  He presented to [place] neuro today for follow-up of his cervical wound and was sent for a venous Doppler ultrasound due to symptoms.  An acute DVT of the right gastrocnemius vein was discovered and he was sent to ED for admission and anticoagulation.  Neurosurgery would like to be consulted in the morning to discuss his anticoagulation course given that he recently had surgery.  Patient also on testosterone injections.  Denies tobacco, or illicit drug use.  Denies any personal or family history of blood clots.  Occasional alcohol use.  ED Course:  Temp of 98.9,BP 161/99  WBC of 9.5. Glucose of 112, creatinine of 1.31 which is his baseline.   Review of Systems:  Constitutional: No Weight Change, No Fever ENT/Mouth: No sore throat, No Rhinorrhea Eyes: No Eye Pain, No Vision Changes Cardiovascular: No Chest Pain, no SOB Respiratory: No Cough, No Sputum Gastrointestinal: No Nausea, No Vomiting, No Diarrhea, No Constipation, No Pain Genitourinary: no Urinary Incontinence Musculoskeletal: No Arthralgias, + Myalgias Skin: No Skin Lesions, No Pruritus, Neuro: no Weakness, No Numbness Psych: No Anxiety/Panic, No Depression, no decrease appetite Heme/Lymph: No Bruising,  No Bleeding  Past Medical History:  Diagnosis Date  . Allergic rhinitis, cause unspecified   . Arthritis of neck   . Arthritis of shoulder    "right shoulder blade" per patient  . Chest pain, unspecified   . Chicken pox   . Chronic prostatitis   . Clostridium difficile colitis    following course of amoxicillin 10/2012 then Cefdinir 10/2013; recurrent C. difficile after Flagyl and intolerant to Vancoycin; s/p Dificid followed by fecal transplant 10/28/13  . Elevated blood pressure reading without diagnosis of hypertension   . Esophageal reflux   . Hypertension   . Insomnia, unspecified   . Irritability   . Nonspecific abnormal electrocardiogram (ECG) (EKG)   . Other alopecia   . Other and unspecified hyperlipidemia   . Other testicular hypofunction   . Sleep apnea    03/07/2020: "about 3-4 years ago, wears CPAP every night"  . Sleep related leg cramps   . Unspecified contusion of eye   . Unspecified hearing loss   . Unspecified hyperplasia of prostate without urinary obstruction and other lower urinary tract symptoms (LUTS)     Past Surgical History:  Procedure Laterality Date  . DENTAL RESTORATION/EXTRACTION WITH X-RAY    . TONSILLECTOMY  01/31/2009   Richardson Landry     reports that he has never smoked. He has never used smokeless tobacco. He reports current alcohol use. He reports that he does not use drugs.  Allergies  Allergen Reactions  . Ibuprofen Itching, Swelling and Other (See Comments)    Facial swelling   . Other Other (See Comments)    NO "BROAD SPEC ABX -*ONLY TARGETED ONES- DUE  TO C-DIFF THAT ALMOST KILLED HIM" (had a fecal transpllant)  . Bee Venom Swelling and Other (See Comments)    Extreme localized swelling  Bee Stings   . Codeine     Insomnia and Hyperactivity  . Nsaids Swelling and Other (See Comments)    Facial swelling  . Amlodipine Other (See Comments)    Dizziness     Family History  Problem Relation Age of Onset  . Hypertension Mother     . Lung cancer Mother   . Depression Mother   . Hyperlipidemia Mother   . Arthritis Mother   . Kidney cancer Mother   . Cancer Mother        kidney, lung  . CAD Father        CABG age 13  . Hypertension Father   . Stroke Father   . Hyperlipidemia Father   . Prostate cancer Father   . Congestive Heart Failure Father   . Cancer Father        prostate  . Cancer Other        malignancy prostate  . Migraines Sister   . Liver disease Brother        on liver transplant list  . Ulcerative colitis Brother   . COPD Neg Hx   . Diabetes Neg Hx   . Heart disease Neg Hx      Prior to Admission medications   Medication Sig Start Date End Date Taking? Authorizing Provider  buPROPion (WELLBUTRIN XL) 150 MG 24 hr tablet TAKE ONE TABLET BY MOUTH EVERY DAY Patient taking differently: Take 150 mg by mouth daily.  05/14/18  Yes Lada, Satira Anis, MD  Carboxymethylcell-Hypromellose (GENTEAL) 0.25-0.3 % GEL Place 1 application into both eyes 2 (two) times daily as needed (for dryness).   Yes [provider]  cyclobenzaprine (FLEXERIL) 10 MG tablet Take 1 tablet (10 mg total) by mouth 3 (three) times daily as needed for muscle spasms. Patient taking differently: Take 10 mg by mouth in the morning, at noon, and at bedtime.  03/10/20  Yes Viona Gilmore D, NP  docusate sodium (COLACE) 100 MG capsule Take 1 capsule (100 mg total) by mouth 2 (two) times daily. 03/10/20  Yes Viona Gilmore D, NP  gabapentin (NEURONTIN) 100 MG capsule Take 2 capsules (200 mg total) by mouth at bedtime. Patient taking differently: Take 200-300 mg by mouth See admin instructions. Take 200 mg by mouth in the morning, 200 mg in the afternoon, and 200-300 mg at bedtime 01/21/20  Yes Lyndal Pulley, DO  oxyCODONE 10 MG TABS Take 1 tablet (10 mg total) by mouth every 4 (four) hours as needed for severe pain ((score 7 to 10)). Patient taking differently: Take 10 mg by mouth every 4 (four) hours.  03/10/20  Yes Patricia Nettle, NP  PRESCRIPTION MEDICATION CPAP- At bedtime   Yes [provider]  Probiotic Product (PROBIOTIC PO) Take 1 capsule by mouth daily.    Yes [provider]  testosterone cypionate (DEPOTESTOSTERONE CYPIONATE) 200 MG/ML injection Inject 140 mg into the muscle every Tuesday. At night   Yes [provider]  acetaminophen (TYLENOL) 500 MG tablet Take 1,000 mg by mouth every 6 (six) hours as needed for moderate pain or headache.    [provider]  AMBULATORY NON FORMULARY MEDICATION Patient has OSA or probable OSA (Yes or No): yes Is the patient currently using CPAP within the home? (Yes or No): yes If yes to question 2, what  is the current DME provider?  Are there any changes to the order/settings? (if yes, please specify) no If no to question 2, date of sleep study: 10/22/17 Date of face-to-face encounter: 01/13/2018 Settings: auto 5-20 cm h2O Signs and symptoms or probable OSA, mention all that apply (snoring) (morning  Headaches) (witnessed apneas) (choking) (gasping during sleep): snoring, apnea Resmed S/O air/auto with heated humidity.   Enroll in [employer] [employer] CPAP supplies needed: mask of choice, headgear, cushions, filters, climate control tubing and water chamber. 08/24/18   Laverle Hobby, MD  Armodafinil 150 MG tablet Take 1 tablet (150 mg total) by mouth daily. Patient not taking: Reported on 02/29/2020 12/27/19   Laurin Coder, MD  ARTIFICIAL TEAR OINTMENT OP Place 1 application into both eyes daily as needed (dry eyes).    [provider]  Coenzyme Q10 (COQ-10) 100 MG CAPS Take 100 mg by mouth at bedtime.    [provider]  diphenhydramine-acetaminophen (TYLENOL PM) 25-500 MG TABS tablet Take 2 tablets by mouth at bedtime as needed (sleep).    [provider]  LORazepam (ATIVAN) 0.5 MG tablet One by mouth every six hours if needed for stormy days or sleep Patient taking differently: Take 0.5  mg by mouth daily as needed for anxiety.  10/02/18   Arnetha Courser, MD  Misc Natural Products (TART CHERRY ADVANCED) CAPS Take 2 capsules by mouth every evening.    [provider]  omeprazole (PRILOSEC) 40 MG capsule Take 40 mg by mouth every evening. 02/07/20   [provider]  pravastatin (PRAVACHOL) 20 MG tablet TAKE 1 TABLET EVERY OTHER DAY AT BEDTIME Patient taking differently: Take 20 mg by mouth at bedtime.  04/17/19   Hubbard Hartshorn, FNP  Sodium Chloride-Xylitol Angus Seller SINUS CARE SPRAY NA) Place 1 spray into the nose daily as needed (allergies).    [provider]  tamsulosin (FLOMAX) 0.4 MG CAPS capsule Take 0.4 mg by mouth daily as needed (urinary flow issues).    [provider]  TURMERIC PO Take 1 tablet by mouth every evening.    [provider]  valsartan (DIOVAN) 320 MG tablet Take 320 mg by mouth at bedtime.    [provider]    Physical Exam: Vitals:   03/14/20 1645  BP: (!) 161/99  Pulse: 88  Resp: 18  Temp: 98.9 F (37.2 C)  TempSrc: Oral  SpO2: 100%  Weight: 108 kg  Height: 6' (1.829 m)    Constitutional: NAD, calm, comfortable young male sitting upright on side of bed Vitals:   03/14/20 1645  BP: (!) 161/99  Pulse: 88  Resp: 18  Temp: 98.9 F (37.2 C)  TempSrc: Oral  SpO2: 100%  Weight: 108 kg  Height: 6' (1.829 m)   Eyes: PERRL, lids and conjunctivae normal, wears corrective glasses ENMT: Mucous membranes are moist.  Neck: normal, supple, wearing C-spine brace Respiratory: clear to auscultation bilaterally, no wheezing, no crackles. Normal respiratory effort on room air. No accessory muscle use.  Cardiovascular: Regular rate and rhythm, no murmurs / rubs / gallops. No extremity edema. 2+ pedal pulses.  Abdomen: no tenderness, no masses palpated.  Bowel sounds positive.  Musculoskeletal: no clubbing / cyanosis. No joint deformity upper and lower extremities. Good ROM, no contractures. Normal  muscle tone.  No palpable cord noted to right calf. Skin: no rashes, lesions, ulcers. No induration Neurologic: CN 2-12 grossly intact. Sensation intact. Strength 5/5 in all 4.  Psychiatric: Normal judgment and  insight. Alert and oriented x 3. Normal mood.     Labs on Admission: I have personally reviewed following labs and imaging studies  CBC: Recent Labs  Lab 03/14/20 1656  WBC 9.5  HGB 14.6  HCT 46.9  MCV 82.1  PLT 0000000   Basic Metabolic Panel: Recent Labs  Lab 03/14/20 1656  NA 141  K 4.6  CL 102  CO2 32  GLUCOSE 112*  BUN 13  CREATININE 1.31*  CALCIUM 9.4   GFR: Estimated Creatinine Clearance: 81.9 mL/min (A) (by C-G formula based on SCr of 1.31 mg/dL (H)). Liver Function Tests: No results for input(s): AST, ALT, ALKPHOS, BILITOT, PROT, ALBUMIN in the last 168 hours. No results for input(s): LIPASE, AMYLASE in the last 168 hours. No results for input(s): AMMONIA in the last 168 hours. Coagulation Profile: Recent Labs  Lab 03/14/20 1656  INR 1.1   Cardiac Enzymes: No results for input(s): CKTOTAL, CKMB, CKMBINDEX, TROPONINI in the last 168 hours. BNP (last 3 results) No results for input(s): PROBNP in the last 8760 hours. HbA1C: No results for input(s): HGBA1C in the last 72 hours. CBG: No results for input(s): GLUCAP in the last 168 hours. Lipid Profile: No results for input(s): CHOL, HDL, LDLCALC, TRIG, CHOLHDL, LDLDIRECT in the last 72 hours. Thyroid Function Tests: No results for input(s): TSH, T4TOTAL, FREET4, T3FREE, THYROIDAB in the last 72 hours. Anemia Panel: No results for input(s): VITAMINB12, FOLATE, FERRITIN, TIBC, IRON, RETICCTPCT in the last 72 hours. Urine analysis:    Component Value Date/Time   COLORURINE Straw 10/04/2013 1134   APPEARANCEUR Clear 10/04/2013 1134   LABSPEC 1.002 10/04/2013 1134   PHURINE 7.0 10/04/2013 1134   GLUCOSEU Negative 10/04/2013 1134   HGBUR Negative 10/04/2013 1134   BILIRUBINUR Negative 10/04/2013  1134   KETONESUR Negative 10/04/2013 1134   PROTEINUR Negative 10/04/2013 1134   NITRITE Negative 10/04/2013 1134   LEUKOCYTESUR Negative 10/04/2013 1134    Radiological Exams on Admission: VAS [place] LOWER EXTREMITY VENOUS (DVT)  Result Date: 03/14/2020  Lower Venous DVTStudy Indications: Pain, and Recent cervical fusion.  Comparison Study: No prior study Performing Technologist: Maudry Mayhew MHA, RDMS, RVT, RDCS  Examination Guidelines: A complete evaluation includes B-mode imaging, spectral Doppler, color Doppler, and power Doppler as needed of all accessible portions of each vessel. Bilateral testing is considered an integral part of a complete examination. Limited examinations for reoccurring indications may be performed as noted. The reflux portion of the exam is performed with the patient in reverse Trendelenburg.  +---------+---------------+---------+-----------+----------+--------------+ RIGHT    CompressibilityPhasicitySpontaneityPropertiesThrombus Aging +---------+---------------+---------+-----------+----------+--------------+ CFV      Full           Yes      Yes                                 +---------+---------------+---------+-----------+----------+--------------+ SFJ      Full                                                        +---------+---------------+---------+-----------+----------+--------------+ FV Prox  Full                                                        +---------+---------------+---------+-----------+----------+--------------+  FV Mid   Full                                                        +---------+---------------+---------+-----------+----------+--------------+ FV DistalFull                                                        +---------+---------------+---------+-----------+----------+--------------+ PFV      Full                                                         +---------+---------------+---------+-----------+----------+--------------+ POP      Full           Yes      Yes                                 +---------+---------------+---------+-----------+----------+--------------+ PTV      Full                                                        +---------+---------------+---------+-----------+----------+--------------+ PERO     Full                                                        +---------+---------------+---------+-----------+----------+--------------+ Gastroc  None                    No                   Acute          +---------+---------------+---------+-----------+----------+--------------+   +----+---------------+---------+-----------+----------+--------------+ LEFTCompressibilityPhasicitySpontaneityPropertiesThrombus Aging +----+---------------+---------+-----------+----------+--------------+ CFV Full           Yes      Yes                                 +----+---------------+---------+-----------+----------+--------------+     Summary: RIGHT: - Findings consistent with acute deep vein thrombosis involving the right gastrocnemius veins. - No cystic structure found in the popliteal fossa.  LEFT: - No evidence of common femoral vein obstruction.  *See table(s) above for measurements and observations. Electronically signed by Deitra Mayo MD on 03/14/2020 at 6:51:21 PM.    Final    Assessment/Plan  Acute right gastrocnemius vein DVT Continue heparin infusion Monitor hemoglobin given patient recently had cervical decompression surgery on 4/23 Neurosurgery would like to be consulted in the morning to discuss anticoagulation  Cervical discectomy/decompression Had surgery with neurosurgery on 4/23 Continue with C-spine brace Continue PRN Flexeril and pain medication  Hypertension Continue antihypertensives  CKD stage II Stable  Avoid nephrotoxic agent  OSA Continue  CPAP  Hyperlipidemia Continue statin  DVT prophylaxis: Heparin infusion Code Status: Full Family Communication: Plan discussed with patient and wife at bedside  disposition Plan: Home with observation Consults called:  Admission status: Observation Status is: Observation  The patient remains OBS appropriate and will d/c before 2 midnights.  Dispo: The patient is from: Home              Anticipated d/c is to: Home              Anticipated d/c date is: 1 day              Patient currently is not medically stable to d/c.         Orene Desanctis DO [facility]   If 7PM-7AM, please contact night-coverage [email or web address]   03/14/2020, 7:54 PM

## 2020-03-14 NOTE — ED Notes (Signed)
Call [phone] when in room for [facility]

## 2020-03-14 NOTE — Progress Notes (Signed)
ANTICOAGULATION CONSULT NOTE - Initial Consult  Pharmacy Consult for Heparin Indication: DVT  Allergies  Allergen Reactions  . Bee Venom Swelling    Extreme localized swelling   . Codeine     Insomnia and Hyperactivity  . Ibuprofen Itching and Swelling    Facial swelling  . Nsaids Other (See Comments)    Facial swelling  . Amlodipine Other (See Comments)    dizziness    Patient Measurements: Height: 6' (182.9 cm) Weight: 108 kg (238 lb) IBW/kg (Calculated) : 77.6 Heparin Dosing Weight: 100.3 kg  Vital Signs: Temp: 98.9 F (37.2 C) (04/27 1645) Temp Source: Oral (04/27 1645) BP: 161/99 (04/27 1645) Pulse Rate: 88 (04/27 1645)  Labs: Recent Labs    03/14/20 1656  HGB 14.6  HCT 46.9  PLT 246  LABPROT 13.6  INR 1.1  CREATININE 1.31*    Estimated Creatinine Clearance: 81.9 mL/min (A) (by C-G formula based on SCr of 1.31 mg/dL (H)).   Medical History: Past Medical History:  Diagnosis Date  . Allergic rhinitis, cause unspecified   . Arthritis of neck   . Arthritis of shoulder    "right shoulder blade" per patient  . Chest pain, unspecified   . Chicken pox   . Chronic prostatitis   . Clostridium difficile colitis    following course of amoxicillin 10/2012 then Cefdinir 10/2013; recurrent C. difficile after Flagyl and intolerant to Vancoycin; s/p Dificid followed by fecal transplant 10/28/13  . Elevated blood pressure reading without diagnosis of hypertension   . Esophageal reflux   . Hypertension   . Insomnia, unspecified   . Irritability   . Nonspecific abnormal electrocardiogram (ECG) (EKG)   . Other alopecia   . Other and unspecified hyperlipidemia   . Other testicular hypofunction   . Sleep apnea    03/07/2020: "about 3-4 years ago, wears CPAP every night"  . Sleep related leg cramps   . Unspecified contusion of eye   . Unspecified hearing loss   . Unspecified hyperplasia of prostate without urinary obstruction and other lower urinary tract symptoms  (LUTS)      Assessment: 54 yo M presents with new acute RLE DVT. Patient is s/p recent spinal surgery on 4/22. Pharmacy asked to dose heparin. No AC noted PTA. H/H and plts wnl. Scr 1.31.  Goal of Therapy:  Heparin level 0.3-0.7 units/ml - NO bolus Monitor platelets by anticoagulation protocol: Yes   Plan:  Start heparin infusion at 1600 units/hr (no bolus) Check 6-hr HL Monitor daily HL, CBC, s/sx bleeding  Richardine Service, PharmD PGY1 Pharmacy Resident Phone: [phone] 03/14/2020  7:55 PM  Please check [email or web address] for unit-specific pharmacy phone numbers.

## 2020-03-15 ENCOUNTER — Encounter (HOSPITAL_COMMUNITY): Payer: Self-pay | Admitting: Family Medicine

## 2020-03-15 ENCOUNTER — Ambulatory Visit: Payer: 59 | Admitting: Anesthesiology

## 2020-03-15 DIAGNOSIS — Z91030 Bee allergy status: Secondary | ICD-10-CM | POA: Diagnosis not present

## 2020-03-15 DIAGNOSIS — N183 Chronic kidney disease, stage 3 unspecified: Secondary | ICD-10-CM | POA: Diagnosis present

## 2020-03-15 DIAGNOSIS — M19011 Primary osteoarthritis, right shoulder: Secondary | ICD-10-CM | POA: Diagnosis present

## 2020-03-15 DIAGNOSIS — R1310 Dysphagia, unspecified: Secondary | ICD-10-CM | POA: Diagnosis present

## 2020-03-15 DIAGNOSIS — G4733 Obstructive sleep apnea (adult) (pediatric): Secondary | ICD-10-CM | POA: Diagnosis present

## 2020-03-15 DIAGNOSIS — M47812 Spondylosis without myelopathy or radiculopathy, cervical region: Secondary | ICD-10-CM | POA: Diagnosis present

## 2020-03-15 DIAGNOSIS — J309 Allergic rhinitis, unspecified: Secondary | ICD-10-CM | POA: Diagnosis present

## 2020-03-15 DIAGNOSIS — N400 Enlarged prostate without lower urinary tract symptoms: Secondary | ICD-10-CM | POA: Diagnosis present

## 2020-03-15 DIAGNOSIS — N411 Chronic prostatitis: Secondary | ICD-10-CM | POA: Diagnosis present

## 2020-03-15 DIAGNOSIS — E291 Testicular hypofunction: Secondary | ICD-10-CM | POA: Diagnosis present

## 2020-03-15 DIAGNOSIS — Z885 Allergy status to narcotic agent status: Secondary | ICD-10-CM | POA: Diagnosis not present

## 2020-03-15 DIAGNOSIS — Z8249 Family history of ischemic heart disease and other diseases of the circulatory system: Secondary | ICD-10-CM | POA: Diagnosis not present

## 2020-03-15 DIAGNOSIS — Z8261 Family history of arthritis: Secondary | ICD-10-CM | POA: Diagnosis not present

## 2020-03-15 DIAGNOSIS — I82461 Acute embolism and thrombosis of right calf muscular vein: Secondary | ICD-10-CM | POA: Diagnosis present

## 2020-03-15 DIAGNOSIS — Z20822 Contact with and (suspected) exposure to covid-19: Secondary | ICD-10-CM | POA: Diagnosis present

## 2020-03-15 DIAGNOSIS — M79661 Pain in right lower leg: Secondary | ICD-10-CM | POA: Diagnosis present

## 2020-03-15 DIAGNOSIS — I82409 Acute embolism and thrombosis of unspecified deep veins of unspecified lower extremity: Secondary | ICD-10-CM | POA: Diagnosis present

## 2020-03-15 DIAGNOSIS — Z886 Allergy status to analgesic agent status: Secondary | ICD-10-CM | POA: Diagnosis not present

## 2020-03-15 DIAGNOSIS — Z9889 Other specified postprocedural states: Secondary | ICD-10-CM | POA: Diagnosis not present

## 2020-03-15 DIAGNOSIS — Z888 Allergy status to other drugs, medicaments and biological substances status: Secondary | ICD-10-CM | POA: Diagnosis not present

## 2020-03-15 DIAGNOSIS — I129 Hypertensive chronic kidney disease with stage 1 through stage 4 chronic kidney disease, or unspecified chronic kidney disease: Secondary | ICD-10-CM | POA: Diagnosis present

## 2020-03-15 DIAGNOSIS — H9190 Unspecified hearing loss, unspecified ear: Secondary | ICD-10-CM | POA: Diagnosis present

## 2020-03-15 DIAGNOSIS — Z83438 Family history of other disorder of lipoprotein metabolism and other lipidemia: Secondary | ICD-10-CM | POA: Diagnosis not present

## 2020-03-15 DIAGNOSIS — Z9289 Personal history of other medical treatment: Secondary | ICD-10-CM | POA: Diagnosis not present

## 2020-03-15 DIAGNOSIS — Z8619 Personal history of other infectious and parasitic diseases: Secondary | ICD-10-CM | POA: Diagnosis not present

## 2020-03-15 DIAGNOSIS — Z881 Allergy status to other antibiotic agents status: Secondary | ICD-10-CM | POA: Diagnosis not present

## 2020-03-15 DIAGNOSIS — N182 Chronic kidney disease, stage 2 (mild): Secondary | ICD-10-CM | POA: Diagnosis not present

## 2020-03-15 DIAGNOSIS — E785 Hyperlipidemia, unspecified: Secondary | ICD-10-CM | POA: Diagnosis present

## 2020-03-15 LAB — BASIC METABOLIC PANEL
Anion gap: 12 (ref 5–15)
BUN: 12 mg/dL (ref 6–20)
CO2: 28 mmol/L (ref 22–32)
Calcium: 9.2 mg/dL (ref 8.9–10.3)
Chloride: 99 mmol/L (ref 98–111)
Creatinine, Ser: 1.2 mg/dL (ref 0.61–1.24)
GFR calc Af Amer: >60 mL/min (ref >60)
GFR calc non Af Amer: >60 mL/min (ref >60)
Glucose, Bld: 108 mg/dL — ABNORMAL HIGH (ref 70–99)
Potassium: 3.7 mmol/L (ref 3.5–5.1)
Sodium: 139 mmol/L (ref 135–145)

## 2020-03-15 LAB — HEPARIN LEVEL (UNFRACTIONATED)
Heparin Unfractionated: 0.25 IU/mL — ABNORMAL LOW (ref 0.30–0.70)
Heparin Unfractionated: 0.42 IU/mL (ref 0.30–0.70)
Heparin Unfractionated: 0.43 IU/mL (ref 0.30–0.70)

## 2020-03-15 LAB — CBC
HCT: 44.5 % (ref 39.0–52.0)
Hemoglobin: 13.9 g/dL (ref 13.0–17.0)
MCH: 25.4 pg — ABNORMAL LOW (ref 26.0–34.0)
MCHC: 31.2 g/dL (ref 30.0–36.0)
MCV: 81.2 fL (ref 80.0–100.0)
Platelets: 233 10*3/uL (ref 150–400)
RBC: 5.48 MIL/uL (ref 4.22–5.81)
RDW: 17.2 % — ABNORMAL HIGH (ref 11.5–15.5)
WBC: 9 10*3/uL (ref 4.0–10.5)
nRBC: 0 % (ref 0.0–0.2)

## 2020-03-15 MED ORDER — DEXAMETHASONE SODIUM PHOSPHATE 4 MG/ML IJ SOLN
4.0000 mg | Freq: Four times a day (QID) | INTRAMUSCULAR | Status: AC
  Administered 2020-03-15 (×2): 4 mg via INTRAVENOUS
  Filled 2020-03-15 (×2): qty 1

## 2020-03-15 NOTE — Plan of Care (Signed)
  Problem: Clinical Measurements: Goal: Will remain free from infection Outcome: Progressing Goal: Respiratory complications will improve Outcome: Progressing Goal: Cardiovascular complication will be avoided Outcome: Progressing   

## 2020-03-15 NOTE — Progress Notes (Signed)
ANTICOAGULATION CONSULT NOTE - Initial Consult  Pharmacy Consult for heparin Indication:  RLE gastrocnemius veins DVT   Patient Measurements: Height: 6' (182.9 cm) Weight: 107 kg (235 lb 14.3 oz) IBW/kg (Calculated) : 77.6 Heparin Dosing Weight: 100 kg  Vital Signs: Temp: 99.1 F (37.3 C) (04/28 2036) Temp Source: Oral (04/28 2036) BP: 149/101 (04/28 2036) Pulse Rate: 84 (04/28 2036)  Labs: Recent Labs    03/14/20 1656 03/15/20 0304 03/15/20 1454 03/15/20 2025  HGB 14.6 13.9  --   --   HCT 46.9 44.5  --   --   PLT 246 233  --   --   LABPROT 13.6  --   --   --   INR 1.1  --   --   --   HEPARINUNFRC  --  0.25* 0.42 0.43  CREATININE 1.31* 1.20  --   --     Estimated Creatinine Clearance: 89 mL/min (by C-G formula based on SCr of 1.2 mg/dL).   Assessment: 54 yo M w/ acute RLE gastrocnemius vein DVTs 03/14/20 started on heparin. No bolus given due to recent cervical spine surgery 03/09/20.   HL came back this PM at 0.43. We are doing a modified goal due to his recent surgery.   Goal of Therapy:  Heparin level 0.3- 0.5 units/ml  Monitor platelets by anticoagulation protocol: Yes   Plan:   Cont heparin 1750 units/hr Daily CBC and heparin level Monitor bleeding, CBC for 48 hrs, if stable ok to transition to PO anticoagulation per Neurosurgery   Alanda Slim, PharmD, [facility] Clinical Pharmacist Please see AMION for all Pharmacists' Contact Phone Numbers 03/15/2020, 9:10 PM

## 2020-03-15 NOTE — Progress Notes (Signed)
Patient has home unit for use. RT will continue to monitor.

## 2020-03-15 NOTE — Progress Notes (Signed)
[place] for Heparin Indication: DVT  Allergies  Allergen Reactions  . Ibuprofen Itching, Swelling and Other (See Comments)    Facial swelling   . Other Other (See Comments)    NO "BROAD SPECTRUM ABX - ONLY "TARGETED" ONES, DUE TO C-DIFF THAT "ALMOST KILLED HIM" (had a fecal transplant)  . Bee Venom Swelling and Other (See Comments)    Extreme localized swelling from bee stings   . Codeine     Insomnia and Hyperactivity  . Nsaids Swelling and Other (See Comments)    Facial swelling  . Amlodipine Other (See Comments)    Dizziness     Patient Measurements: Height: 6' (182.9 cm) Weight: 107 kg (235 lb 14.3 oz) IBW/kg (Calculated) : 77.6 Heparin Dosing Weight: 100.3 kg  Vital Signs: Temp: 98.2 F (36.8 C) (04/28 0344) Temp Source: Oral (04/28 0344) BP: 127/89 (04/28 0344) Pulse Rate: 80 (04/28 0344)  Labs: Recent Labs    03/14/20 1656 03/15/20 0304  HGB 14.6 13.9  HCT 46.9 44.5  PLT 246 233  LABPROT 13.6  --   INR 1.1  --   HEPARINUNFRC  --  0.25*  CREATININE 1.31* 1.20    Estimated Creatinine Clearance: 89 mL/min (by C-G formula based on SCr of 1.2 mg/dL).  Assessment: 54 y.o. male with DVT for heparin  Goal of Therapy:  Heparin level 0.3-0.7 units/ml - NO bolus Monitor platelets by anticoagulation protocol: Yes   Plan:  Increase Heparin 1750 units/hr   Check heparin level in 8 hours.  Phillis Knack, PharmD, BCPS

## 2020-03-15 NOTE — Progress Notes (Signed)
ANTICOAGULATION CONSULT NOTE - Initial Consult  Pharmacy Consult for heparin Indication:  RLE gastrocnemius veins DVT   Patient Measurements: Height: 6' (182.9 cm) Weight: 107 kg (235 lb 14.3 oz) IBW/kg (Calculated) : 77.6 Heparin Dosing Weight: 100 kg  Vital Signs: Temp: 98.6 F (37 C) (04/28 1139) Temp Source: Oral (04/28 1139) BP: 143/93 (04/28 1139) Pulse Rate: 83 (04/28 1139)  Labs: Recent Labs    03/14/20 1656 03/15/20 0304 03/15/20 1454  HGB 14.6 13.9  --   HCT 46.9 44.5  --   PLT 246 233  --   LABPROT 13.6  --   --   INR 1.1  --   --   HEPARINUNFRC  --  0.25* 0.42  CREATININE 1.31* 1.20  --     Estimated Creatinine Clearance: 89 mL/min (by C-G formula based on SCr of 1.2 mg/dL).   Assessment: 54 yo M w/ acute RLE gastrocnemius vein DVTs 03/14/20 started on heparin. No bolus given due to recent cervical spine surgery 03/09/20.   HL came back therapeutic this PM at 0.42. We are doing a modified goal due to his recent surgery. Continue same rate and check a confirm level in 6 hr.    Goal of Therapy:  Heparin level 0.3- 0.5 units/ml  Monitor platelets by anticoagulation protocol: Yes   Plan:   Cont heparin 1750 units/hr Check 6 hr confirm HL Monitor bleeding, CBC for 48 hrs, if stable ok to transition to PO anticoagulation per Neurosurgery   Onnie Boer, PharmD, BCIDP, AAHIVP, CPP Infectious Disease Pharmacist 03/15/2020 4:08 PM

## 2020-03-15 NOTE — Progress Notes (Signed)
Subjective: The patient is alert and pleasant.  He complains of neck and shoulder soreness.  Objective: Vital signs in last 24 hours: Temp:  [98.1 F (36.7 C)-98.9 F (37.2 C)] 98.1 F (36.7 C) (04/28 0833) Pulse Rate:  [80-95] 80 (04/28 0833) Resp:  [14-20] 18 (04/28 0833) BP: (127-164)/(88-107) 129/88 (04/28 0833) SpO2:  [92 %-100 %] 96 % (04/28 0833) Weight:  [107 kg-108 kg] 107 kg (04/28 0000) Estimated body mass index is 31.99 kg/m as calculated from the following:   Height as of this encounter: 6' (1.829 m).   Weight as of this encounter: 107 kg.   Intake/Output from previous day: 04/27 0701 - 04/28 0700 In: 66 [I.V.:66] Out: -  Intake/Output this shift: No intake/output data recorded.  Physical exam the patient is alert and pleasant.  He is moving all 4 extremities well.  His wound is healing well.  There is mild swelling but no significant hematoma or shift.  Lab Results: Recent Labs    03/14/20 1656 03/15/20 0304  WBC 9.5 9.0  HGB 14.6 13.9  HCT 46.9 44.5  PLT 246 233   BMET Recent Labs    03/14/20 1656 03/15/20 0304  NA 141 139  K 4.6 3.7  CL 102 99  CO2 32 28  GLUCOSE 112* 108*  BUN 13 12  CREATININE 1.31* 1.20  CALCIUM 9.4 9.2    Studies/Results: VAS [place] LOWER EXTREMITY VENOUS (DVT)  Result Date: 03/14/2020  Lower Venous DVTStudy Indications: Pain, and Recent cervical fusion.  Comparison Study: No prior study Performing Technologist: Maudry Mayhew MHA, RDMS, RVT, RDCS  Examination Guidelines: A complete evaluation includes B-mode imaging, spectral Doppler, color Doppler, and power Doppler as needed of all accessible portions of each vessel. Bilateral testing is considered an integral part of a complete examination. Limited examinations for reoccurring indications may be performed as noted. The reflux portion of the exam is performed with the patient in reverse Trendelenburg.   +---------+---------------+---------+-----------+----------+--------------+ RIGHT    CompressibilityPhasicitySpontaneityPropertiesThrombus Aging +---------+---------------+---------+-----------+----------+--------------+ CFV      Full           Yes      Yes                                 +---------+---------------+---------+-----------+----------+--------------+ SFJ      Full                                                        +---------+---------------+---------+-----------+----------+--------------+ FV Prox  Full                                                        +---------+---------------+---------+-----------+----------+--------------+ FV Mid   Full                                                        +---------+---------------+---------+-----------+----------+--------------+ FV DistalFull                                                        +---------+---------------+---------+-----------+----------+--------------+  PFV      Full                                                        +---------+---------------+---------+-----------+----------+--------------+ POP      Full           Yes      Yes                                 +---------+---------------+---------+-----------+----------+--------------+ PTV      Full                                                        +---------+---------------+---------+-----------+----------+--------------+ PERO     Full                                                        +---------+---------------+---------+-----------+----------+--------------+ Gastroc  None                    No                   Acute          +---------+---------------+---------+-----------+----------+--------------+   +----+---------------+---------+-----------+----------+--------------+ LEFTCompressibilityPhasicitySpontaneityPropertiesThrombus Aging  +----+---------------+---------+-----------+----------+--------------+ CFV Full           Yes      Yes                                 +----+---------------+---------+-----------+----------+--------------+     Summary: RIGHT: - Findings consistent with acute deep vein thrombosis involving the right gastrocnemius veins. - No cystic structure found in the popliteal fossa.  LEFT: - No evidence of common femoral vein obstruction.  *See table(s) above for measurements and observations. Electronically signed by Deitra Mayo MD on 03/14/2020 at 6:51:21 PM.    Final     Assessment/Plan: Right lower extremity DVT on postoperative day #6 status post 4 level anterior cervical discectomy, fusion and plating: I have discussed the situation with the patient.  I appreciate Dr. Latina Craver care of this patient.  Our plan is to use IV heparin for 48 hours and observe his wound for hematoma formation.  If he tolerates anticoagulation for that period, then we can convert him  over to oral anticoagulation.  I will add a couple doses of Decadron for his neck soreness.  I have answered all the patient's questions.  LOS: 0 days     Ophelia Charter 03/15/2020, 9:59 AM

## 2020-03-15 NOTE — Progress Notes (Signed)
PROGRESS NOTE    ABDALLA ADORNETTO  [id number] DOB: 04-14-1966 DOA: 03/14/2020 PCP: [name], MD   Brief Narrative:  54 y.o. male with medical history significant for hypertension, OSA on CPAP, CKD stage III, hyperlipidemia who presents right calf pain and findings of DVT.Patient underwent cervical anterior discectomy/decompression on 4/23 by neurosurgereon Dr. Arnoldo Morale.  Admitted to the hospital started on heparin drip.   Assessment & Plan:   Principal Problem:   Deep vein blood clot of right lower extremity (HCC) Active Problems:   Dyslipidemia   CKD (chronic kidney disease) stage 2, GFR 60-89 ml/min   OSA on CPAP   H/O cervical spine surgery  Acute right gastrocnemius vein DVT Maintain heparin drip until tomorrow evening.  Observe for any signs of bleeding.  If remains stable will transition to p.o. anticoagulation.  Cervical discectomy/decompression Seen by neurosurgery, continue C-spine brace.  Rest of the medications per their service.  Pain control.  Hypertension Continue antihypertensives  CKD stage II Stable Avoid nephrotoxic agent  OSA Continue CPAP  Hyperlipidemia Continue statin    DVT prophylaxis: Heparin drip Code Status: Full code Family Communication: None Disposition Plan:   Patient From= home  Patient Anticipated D/C place= Home  Barriers= maintain hospital stay for IV heparin drip for at least 48 hours from when it was started.  Transition to p.o. tomorrow evening.  Subjective: Feels okay no complaints besides neck discomfort as expected.   Review of Systems Otherwise negative except as per HPI, including: General: Denies fever, chills, night sweats or unintended weight loss. Resp: Denies cough, wheezing, shortness of breath. Cardiac: Denies chest pain, palpitations, orthopnea, paroxysmal nocturnal dyspnea. GI: Denies abdominal pain, nausea, vomiting, diarrhea or constipation GU: Denies dysuria, frequency, hesitancy or  incontinence MS: Denies muscle aches, joint pain or swelling Neuro: Denies headache, neurologic deficits (focal weakness, numbness, tingling), abnormal gait Psych: Denies anxiety, depression, SI/HI/AVH Skin: Denies new rashes or lesions ID: Denies sick contacts, exotic exposures, travel  Examination:  General exam: Appears calm and comfortable  Respiratory system: Clear to auscultation. Respiratory effort normal. Cardiovascular system: S1 & S2 heard, RRR. No JVD, murmurs, rubs, gallops or clicks. No pedal edema. Gastrointestinal system: Abdomen is nondistended, soft and nontender. No organomegaly or masses felt. Normal bowel sounds heard. Central nervous system: Alert and oriented. No focal neurological deficits.  C-collar in place.  Surgical scars noted. Extremities: Symmetric 5 x 5 power. Skin: No rashes, lesions or ulcers Psychiatry: Judgement and insight appear normal. Mood & affect appropriate.     Objective: Vitals:   03/15/20 0000 03/15/20 0344 03/15/20 0833 03/15/20 1139  BP: (!) 164/107 127/89 129/88 (!) 143/93  Pulse: 95 80 80 83  Resp: 16 14 18  (!) 25  Temp: 98.9 F (37.2 C) 98.2 F (36.8 C) 98.1 F (36.7 C) 98.6 F (37 C)  TempSrc: Oral Oral Oral Oral  SpO2: 95% 96% 96% 95%  Weight: 107 kg     Height: 6' (1.829 m)       Intake/Output Summary (Last 24 hours) at 03/15/2020 1152 Last data filed at 03/15/2020 0059 Gross per 24 hour  Intake 66.03 ml  Output --  Net 66.03 ml   Filed Weights   03/14/20 1645 03/15/20 0000  Weight: 108 kg 107 kg     Data Reviewed:   CBC: Recent Labs  Lab 03/14/20 1656 03/15/20 0304  WBC 9.5 9.0  HGB 14.6 13.9  HCT 46.9 44.5  MCV 82.1 81.2  PLT 246 233   Basic  Metabolic Panel: Recent Labs  Lab 03/14/20 1656 03/15/20 0304  NA 141 139  K 4.6 3.7  CL 102 99  CO2 32 28  GLUCOSE 112* 108*  BUN 13 12  CREATININE 1.31* 1.20  CALCIUM 9.4 9.2   GFR: Estimated Creatinine Clearance: 89 mL/min (by C-G formula based on  SCr of 1.2 mg/dL). Liver Function Tests: No results for input(s): AST, ALT, ALKPHOS, BILITOT, PROT, ALBUMIN in the last 168 hours. No results for input(s): LIPASE, AMYLASE in the last 168 hours. No results for input(s): AMMONIA in the last 168 hours. Coagulation Profile: Recent Labs  Lab 03/14/20 1656  INR 1.1   Cardiac Enzymes: No results for input(s): CKTOTAL, CKMB, CKMBINDEX, TROPONINI in the last 168 hours. BNP (last 3 results) No results for input(s): PROBNP in the last 8760 hours. HbA1C: No results for input(s): HGBA1C in the last 72 hours. CBG: No results for input(s): GLUCAP in the last 168 hours. Lipid Profile: No results for input(s): CHOL, HDL, LDLCALC, TRIG, CHOLHDL, LDLDIRECT in the last 72 hours. Thyroid Function Tests: No results for input(s): TSH, T4TOTAL, FREET4, T3FREE, THYROIDAB in the last 72 hours. Anemia Panel: No results for input(s): VITAMINB12, FOLATE, FERRITIN, TIBC, IRON, RETICCTPCT in the last 72 hours. Sepsis Labs: No results for input(s): PROCALCITON, LATICACIDVEN in the last 168 hours.  Recent Results (from the past 240 hour(s))  Surgical pcr screen     Status: None   Collection Time: 03/07/20 11:35 AM   Specimen: Nasal Mucosa; Nasal Swab  Result Value Ref Range Status   MRSA, PCR NEGATIVE NEGATIVE Final   Staphylococcus aureus NEGATIVE NEGATIVE Final    Comment: (NOTE) The Xpert SA Assay (FDA approved for NASAL specimens in patients 53 years of age and older), is one component of a comprehensive surveillance program. It is not intended to diagnose infection nor to guide or monitor treatment. Performed at [facility], [place] [address], [place], [place] [address]   SARS CORONAVIRUS 2 (TAT 6-24 HRS) Nasopharyngeal Nasopharyngeal Swab     Status: None   Collection Time: 03/07/20 11:37 AM   Specimen: Nasopharyngeal Swab  Result Value Ref Range Status   SARS Coronavirus 2 NEGATIVE NEGATIVE Final    Comment: (NOTE) SARS-CoV-2 target  nucleic acids are NOT DETECTED. The SARS-CoV-2 RNA is generally detectable in upper and lower respiratory specimens during the acute phase of infection. Negative results do not preclude SARS-CoV-2 infection, do not rule out co-infections with other pathogens, and should not be used as the sole basis for treatment or other patient management decisions. Negative results must be combined with clinical observations, patient history, and epidemiological information. The expected result is Negative. Fact Sheet for Patients: SugarRoll.be Fact Sheet for Healthcare Providers: https://www.woods-mathews.com/ This test is not yet approved or cleared by the [place] FDA and  has been authorized for detection and/or diagnosis of SARS-CoV-2 by FDA under an Emergency Use Authorization (EUA). This EUA will remain  in effect (meaning this test can be used) for the duration of the COVID-19 declaration under Section 56 4(b)(1) of the Act, 21 U.S.C. section 360bbb-3(b)(1), unless the authorization is terminated or revoked sooner. Performed at [facility], [place] [address] [place], [place] [address]   Respiratory Panel by RT PCR (Flu A&B, Covid) - Nasopharyngeal Swab     Status: None   Collection Time: 03/14/20  5:24 PM   Specimen: Nasopharyngeal Swab  Result Value Ref Range Status   SARS Coronavirus 2 by RT PCR NEGATIVE  NEGATIVE Final    Comment: (NOTE) SARS-CoV-2 target nucleic acids are NOT DETECTED. The SARS-CoV-2 RNA is generally detectable in upper respiratoy specimens during the acute phase of infection. The lowest concentration of SARS-CoV-2 viral copies this assay can detect is 131 copies/mL. A negative result does not preclude SARS-Cov-2 infection and should not be used as the sole basis for treatment or other patient management decisions. A negative result may occur with  improper specimen collection/handling, submission of specimen  other than nasopharyngeal swab, presence of viral mutation(s) within the areas targeted by this assay, and inadequate number of viral copies (<131 copies/mL). A negative result must be combined with clinical observations, patient history, and epidemiological information. The expected result is Negative. Fact Sheet for Patients:  PinkCheek.be Fact Sheet for Healthcare Providers:  GravelBags.it This test is not yet ap proved or cleared by the [place] FDA and  has been authorized for detection and/or diagnosis of SARS-CoV-2 by FDA under an Emergency Use Authorization (EUA). This EUA will remain  in effect (meaning this test can be used) for the duration of the COVID-19 declaration under Section 564(b)(1) of the Act, 21 U.S.C. section 360bbb-3(b)(1), unless the authorization is terminated or revoked sooner.    Influenza A by PCR NEGATIVE NEGATIVE Final   Influenza B by PCR NEGATIVE NEGATIVE Final    Comment: (NOTE) The Xpert Xpress SARS-CoV-2/FLU/RSV assay is intended as an aid in  the diagnosis of influenza from Nasopharyngeal swab specimens and  should not be used as a sole basis for treatment. Nasal washings and  aspirates are unacceptable for Xpert Xpress SARS-CoV-2/FLU/RSV  testing. Fact Sheet for Patients: PinkCheek.be Fact Sheet for Healthcare Providers: GravelBags.it This test is not yet approved or cleared by the [place] FDA and  has been authorized for detection and/or diagnosis of SARS-CoV-2 by  FDA under an Emergency Use Authorization (EUA). This EUA will remain  in effect (meaning this test can be used) for the duration of the  Covid-19 declaration under Section 564(b)(1) of the Act, 21  U.S.C. section 360bbb-3(b)(1), unless the authorization is  terminated or revoked. Performed at [facility], [place] [address] [place], [place] [address]           Radiology Studies: VAS [place] LOWER EXTREMITY VENOUS (DVT)  Result Date: 03/14/2020  Lower Venous DVTStudy Indications: Pain, and Recent cervical fusion.  Comparison Study: No prior study Performing Technologist: Maudry Mayhew MHA, RDMS, RVT, RDCS  Examination Guidelines: A complete evaluation includes B-mode imaging, spectral Doppler, color Doppler, and power Doppler as needed of all accessible portions of each vessel. Bilateral testing is considered an integral part of a complete examination. Limited examinations for reoccurring indications may be performed as noted. The reflux portion of the exam is performed with the patient in reverse Trendelenburg.  +---------+---------------+---------+-----------+----------+--------------+ RIGHT    CompressibilityPhasicitySpontaneityPropertiesThrombus Aging +---------+---------------+---------+-----------+----------+--------------+ CFV      Full           Yes      Yes                                 +---------+---------------+---------+-----------+----------+--------------+ SFJ      Full                                                        +---------+---------------+---------+-----------+----------+--------------+  FV Prox  Full                                                        +---------+---------------+---------+-----------+----------+--------------+ FV Mid   Full                                                        +---------+---------------+---------+-----------+----------+--------------+ FV DistalFull                                                        +---------+---------------+---------+-----------+----------+--------------+ PFV      Full                                                        +---------+---------------+---------+-----------+----------+--------------+ POP      Full           Yes      Yes                                  +---------+---------------+---------+-----------+----------+--------------+ PTV      Full                                                        +---------+---------------+---------+-----------+----------+--------------+ PERO     Full                                                        +---------+---------------+---------+-----------+----------+--------------+ Gastroc  None                    No                   Acute          +---------+---------------+---------+-----------+----------+--------------+   +----+---------------+---------+-----------+----------+--------------+ LEFTCompressibilityPhasicitySpontaneityPropertiesThrombus Aging +----+---------------+---------+-----------+----------+--------------+ CFV Full           Yes      Yes                                 +----+---------------+---------+-----------+----------+--------------+     Summary: RIGHT: - Findings consistent with acute deep vein thrombosis involving the right gastrocnemius veins. - No cystic structure found in the popliteal fossa.  LEFT: - No evidence of common femoral vein obstruction.  *See table(s) above for measurements and observations. Electronically signed by Deitra Mayo MD on 03/14/2020 at 6:51:21 PM.    Final  Scheduled Meds: . buPROPion  150 mg Oral q AM  . dexamethasone (DECADRON) injection  4 mg Intravenous Q6H  . docusate sodium  100 mg Oral BID  . gabapentin  200 mg Oral TID  . irbesartan  300 mg Oral Daily  . pantoprazole  40 mg Oral Daily  . pravastatin  20 mg Oral QHS  . tamsulosin  0.4 mg Oral Daily   Continuous Infusions: . heparin 1,750 Units/hr (03/15/20 1048)     LOS: 0 days   Time spent= 35 mins    Tenia Goh Arsenio Loader, MD [facility]  If 7PM-7AM, please contact night-coverage  03/15/2020, 11:52 AM

## 2020-03-16 LAB — CBC
HCT: 46.3 % (ref 39.0–52.0)
Hemoglobin: 14.6 g/dL (ref 13.0–17.0)
MCH: 25.4 pg — ABNORMAL LOW (ref 26.0–34.0)
MCHC: 31.5 g/dL (ref 30.0–36.0)
MCV: 80.7 fL (ref 80.0–100.0)
Platelets: 232 10*3/uL (ref 150–400)
RBC: 5.74 MIL/uL (ref 4.22–5.81)
RDW: 17.1 % — ABNORMAL HIGH (ref 11.5–15.5)
WBC: 11.4 10*3/uL — ABNORMAL HIGH (ref 4.0–10.5)
nRBC: 0 % (ref 0.0–0.2)

## 2020-03-16 LAB — HEPARIN LEVEL (UNFRACTIONATED): Heparin Unfractionated: 0.6 IU/mL (ref 0.30–0.70)

## 2020-03-16 MED ORDER — APIXABAN 5 MG PO TABS
5.0000 mg | ORAL_TABLET | Freq: Two times a day (BID) | ORAL | Status: DC
  Administered 2020-03-16 – 2020-03-17 (×2): 5 mg via ORAL
  Filled 2020-03-16 (×3): qty 1

## 2020-03-16 NOTE — Progress Notes (Addendum)
ANTICOAGULATION CONSULT NOTE - Initial Consult  Pharmacy Consult for heparin Indication:  RLE gastrocnemius veins DVT   Patient Measurements: Height: 6' (182.9 cm) Weight: 107 kg (235 lb 14.3 oz) IBW/kg (Calculated) : 77.6 Heparin Dosing Weight: 100 kg  Vital Signs: Temp: 97.8 F (36.6 C) (04/29 0452) Temp Source: Oral (04/29 0452) BP: 129/82 (04/29 0452) Pulse Rate: 69 (04/29 0452)  Labs: Recent Labs    03/14/20 1656 03/14/20 1656 03/15/20 0304 03/15/20 0304 03/15/20 1454 03/15/20 2025 03/16/20 0214  HGB 14.6   < > 13.9  --   --   --  14.6  HCT 46.9  --  44.5  --   --   --  46.3  PLT 246  --  233  --   --   --  232  LABPROT 13.6  --   --   --   --   --   --   INR 1.1  --   --   --   --   --   --   HEPARINUNFRC  --   --  0.25*   < > 0.42 0.43 0.60  CREATININE 1.31*  --  1.20  --   --   --   --    < > = values in this interval not displayed.    Estimated Creatinine Clearance: 89 mL/min (by C-G formula based on SCr of 1.2 mg/dL).   Assessment: 54 yo M w/ acute RLE gastrocnemius vein DVTs 03/14/20 started on heparin. No bolus given and modified heparin goal due to recent cervical spine surgery 03/09/20.   Heparin supratherapeutic this morning at 0.60. H&H stable with no issues with infusion or bleeding reported per RN.  Goal of Therapy:  Heparin level 0.3- 0.5 units/ml  Monitor platelets by anticoagulation protocol: Yes   Plan:   Reduce heparin to 1650 units/hour 6-hour heparin level at 1400 Daily CBC and heparin level Monitor bleeding, CBC for 48 hrs, if stable ok to transition to PO anticoagulation per Neurosurgery      Maddi Collar L. Devin Going, [place] PGY1 Pharmacy Resident [phone] 03/16/20      7:23 AM  Please check AMION for all [place] phone numbers After 10:00 PM, call the [facility] [place] [facility] [phone]    __________________________________________________ 03/16/20 0820 Addendum Plan: - stop heparin at 1800 - start apixaban 10 mg BID today  at 1800, continue until 03/23/2020 then transition to apixaban 5 mg BID    Everrett Lacasse L. Devin Going, [place] PGY1 Pharmacy Resident [phone] 03/16/20     8:24 AM  Please check AMION for all [facility] [place] phone numbers After 10:00 PM, call the [place] [phone]

## 2020-03-16 NOTE — Progress Notes (Signed)
Transitions of Care Pharmacist Note  Tom Wilkerson is a 54 y.o. male that has been diagnosed with DVT and will be prescribed Eliquis (apixaban) at discharge.   Patient Education: I provided the following education on Eliquis 03/16/20 to the patient: How to take the medication Described what the medication is Signs of bleeding Signs/symptoms of VTE and stroke  Answered their questions  Discharge Medications Plan: The patient wants to have their discharge medications filled by the Transitions of Care pharmacy rather than their usual pharmacy.  The discharge orders pharmacy has been changed to the Transitions of Care pharmacy, the patient will receive a phone call regarding co-pay, and their medications will be delivered by the Transitions of Care pharmacy.    Thank you,   Sherren Kerns, PharmD PGY1 Acute Care Pharmacy Resident March 16, 2020

## 2020-03-16 NOTE — Progress Notes (Signed)
PROGRESS NOTE    Tom Wilkerson  [id number] DOB: 19-Apr-1966 DOA: 03/14/2020 PCP: [name], MD   Brief Narrative:  54 y.o. male with medical history significant for hypertension, OSA on CPAP, CKD stage III, hyperlipidemia who presents right calf pain and findings of DVT.Patient underwent cervical anterior discectomy/decompression on 4/23 by neurosurgereon Dr. Arnoldo Morale.  Admitted to the hospital started on heparin drip.  Plans to transition to Eliquis after 48 hours of IV heparin   Assessment & Plan:   Principal Problem:   Deep vein blood clot of right lower extremity (HCC) Active Problems:   Dyslipidemia   CKD (chronic kidney disease) stage 2, GFR 60-89 ml/min   OSA on CPAP   H/O cervical spine surgery   DVT (deep venous thrombosis) (HCC)  Acute right gastrocnemius vein DVT Maintain IV heparin until later on today then transition to Eliquis.  Cervical discectomy/decompression Seen by neurosurgery, continue C-spine brace.  Rest of the medications per their service.  Pain control.  Hypertension Continue antihypertensives  CKD stage II Stable Avoid nephrotoxic agent  OSA Continue CPAP  Hyperlipidemia Continue statin    DVT prophylaxis: Heparin drip Code Status: Full code Family Communication: Spoke extensively with family his wife and the patient at bedside Disposition Plan:   Patient From= home  Patient Anticipated D/C place= Home  Barriers= transition from heparin drip to Eliquis later on tonight.  Discharging home tomorrow.  Subjective: Patient is very anxious about his diagnosis.  I spoke extensively with the patient and his wife at bedside and answered all the questions.  They are very content with the information that was provided.  No complaints otherwise   Review of Systems Otherwise negative except as per HPI, including: General: Denies fever, chills, night sweats or unintended weight loss. Resp: Denies cough, wheezing, shortness of  breath. Cardiac: Denies chest pain, palpitations, orthopnea, paroxysmal nocturnal dyspnea. GI: Denies abdominal pain, nausea, vomiting, diarrhea or constipation GU: Denies dysuria, frequency, hesitancy or incontinence MS: Denies muscle aches, joint pain or swelling Neuro: Denies headache, neurologic deficits (focal weakness, numbness, tingling), abnormal gait Psych: Denies anxiety, depression, SI/HI/AVH Skin: Denies new rashes or lesions ID: Denies sick contacts, exotic exposures, travel  Examination:  Constitutional: Not in acute distress Respiratory: Clear to auscultation bilaterally Cardiovascular: Normal sinus rhythm, no rubs Abdomen: Nontender nondistended good bowel sounds Musculoskeletal: No edema noted Skin: No rashes seen Neurologic: CN 2-12 grossly intact.  And nonfocal Psychiatric: Normal judgment and insight. Alert and oriented x 3. Normal mood.  Objective: Vitals:   03/16/20 0058 03/16/20 0452 03/16/20 0814 03/16/20 1000  BP: (!) 149/91 129/82 (!) 142/94   Pulse: 79 69 84 88  Resp: 14 14 18 17   Temp: 98.6 F (37 C) 97.8 F (36.6 C) 98.4 F (36.9 C)   TempSrc: Oral Oral Oral   SpO2: 96% 95% 99% 96%  Weight:      Height:       No intake or output data in the 24 hours ending 03/16/20 1106 Filed Weights   03/14/20 1645 03/15/20 0000  Weight: 108 kg 107 kg     Data Reviewed:   CBC: Recent Labs  Lab 03/14/20 1656 03/15/20 0304 03/16/20 0214  WBC 9.5 9.0 11.4*  HGB 14.6 13.9 14.6  HCT 46.9 44.5 46.3  MCV 82.1 81.2 80.7  PLT 246 233 [id number]   Basic Metabolic Panel: Recent Labs  Lab 03/14/20 1656 03/15/20 0304  NA 141 139  K 4.6 3.7  CL 102 99  CO2  32 28  GLUCOSE 112* 108*  BUN 13 12  CREATININE 1.31* 1.20  CALCIUM 9.4 9.2   GFR: Estimated Creatinine Clearance: 89 mL/min (by C-G formula based on SCr of 1.2 mg/dL). Liver Function Tests: No results for input(s): AST, ALT, ALKPHOS, BILITOT, PROT, ALBUMIN in the last 168 hours. No results for  input(s): LIPASE, AMYLASE in the last 168 hours. No results for input(s): AMMONIA in the last 168 hours. Coagulation Profile: Recent Labs  Lab 03/14/20 1656  INR 1.1   Cardiac Enzymes: No results for input(s): CKTOTAL, CKMB, CKMBINDEX, TROPONINI in the last 168 hours. BNP (last 3 results) No results for input(s): PROBNP in the last 8760 hours. HbA1C: No results for input(s): HGBA1C in the last 72 hours. CBG: No results for input(s): GLUCAP in the last 168 hours. Lipid Profile: No results for input(s): CHOL, HDL, LDLCALC, TRIG, CHOLHDL, LDLDIRECT in the last 72 hours. Thyroid Function Tests: No results for input(s): TSH, T4TOTAL, FREET4, T3FREE, THYROIDAB in the last 72 hours. Anemia Panel: No results for input(s): VITAMINB12, FOLATE, FERRITIN, TIBC, IRON, RETICCTPCT in the last 72 hours. Sepsis Labs: No results for input(s): PROCALCITON, LATICACIDVEN in the last 168 hours.  Recent Results (from the past 240 hour(s))  Surgical pcr screen     Status: None   Collection Time: 03/07/20 11:35 AM   Specimen: Nasal Mucosa; Nasal Swab  Result Value Ref Range Status   MRSA, PCR NEGATIVE NEGATIVE Final   Staphylococcus aureus NEGATIVE NEGATIVE Final    Comment: (NOTE) The Xpert SA Assay (FDA approved for NASAL specimens in patients 55 years of age and older), is one component of a comprehensive surveillance program. It is not intended to diagnose infection nor to guide or monitor treatment. Performed at [facility], [place] [address] [place], [place] [address]   SARS CORONAVIRUS 2 (TAT 6-24 HRS) Nasopharyngeal Nasopharyngeal Swab     Status: None   Collection Time: 03/07/20 11:37 AM   Specimen: Nasopharyngeal Swab  Result Value Ref Range Status   SARS Coronavirus 2 NEGATIVE NEGATIVE Final    Comment: (NOTE) SARS-CoV-2 target nucleic acids are NOT DETECTED. The SARS-CoV-2 RNA is generally detectable in upper and lower respiratory specimens during the acute phase of infection.  Negative results do not preclude SARS-CoV-2 infection, do not rule out co-infections with other pathogens, and should not be used as the sole basis for treatment or other patient management decisions. Negative results must be combined with clinical observations, patient history, and epidemiological information. The expected result is Negative. Fact Sheet for Patients: SugarRoll.be Fact Sheet for Healthcare Providers: https://www.woods-mathews.com/ This test is not yet approved or cleared by the [place] FDA and  has been authorized for detection and/or diagnosis of SARS-CoV-2 by FDA under an Emergency Use Authorization (EUA). This EUA will remain  in effect (meaning this test can be used) for the duration of the COVID-19 declaration under Section 56 4(b)(1) of the Act, 21 U.S.C. section 360bbb-3(b)(1), unless the authorization is terminated or revoked sooner. Performed at [facility], [place] [address] [place], [place] [address]   Respiratory Panel by RT PCR (Flu A&B, Covid) - Nasopharyngeal Swab     Status: None   Collection Time: 03/14/20  5:24 PM   Specimen: Nasopharyngeal Swab  Result Value Ref Range Status   SARS Coronavirus 2 by RT PCR NEGATIVE NEGATIVE Final    Comment: (NOTE) SARS-CoV-2 target nucleic acids are NOT DETECTED. The SARS-CoV-2 RNA is generally detectable in upper respiratoy specimens  during the acute phase of infection. The lowest concentration of SARS-CoV-2 viral copies this assay can detect is 131 copies/mL. A negative result does not preclude SARS-Cov-2 infection and should not be used as the sole basis for treatment or other patient management decisions. A negative result may occur with  improper specimen collection/handling, submission of specimen other than nasopharyngeal swab, presence of viral mutation(s) within the areas targeted by this assay, and inadequate number of viral copies (<131 copies/mL). A  negative result must be combined with clinical observations, patient history, and epidemiological information. The expected result is Negative. Fact Sheet for Patients:  PinkCheek.be Fact Sheet for Healthcare Providers:  GravelBags.it This test is not yet ap proved or cleared by the [place] FDA and  has been authorized for detection and/or diagnosis of SARS-CoV-2 by FDA under an Emergency Use Authorization (EUA). This EUA will remain  in effect (meaning this test can be used) for the duration of the COVID-19 declaration under Section 564(b)(1) of the Act, 21 U.S.C. section 360bbb-3(b)(1), unless the authorization is terminated or revoked sooner.    Influenza A by PCR NEGATIVE NEGATIVE Final   Influenza B by PCR NEGATIVE NEGATIVE Final    Comment: (NOTE) The Xpert Xpress SARS-CoV-2/FLU/RSV assay is intended as an aid in  the diagnosis of influenza from Nasopharyngeal swab specimens and  should not be used as a sole basis for treatment. Nasal washings and  aspirates are unacceptable for Xpert Xpress SARS-CoV-2/FLU/RSV  testing. Fact Sheet for Patients: PinkCheek.be Fact Sheet for Healthcare Providers: GravelBags.it This test is not yet approved or cleared by the [place] FDA and  has been authorized for detection and/or diagnosis of SARS-CoV-2 by  FDA under an Emergency Use Authorization (EUA). This EUA will remain  in effect (meaning this test can be used) for the duration of the  Covid-19 declaration under Section 564(b)(1) of the Act, 21  U.S.C. section 360bbb-3(b)(1), unless the authorization is  terminated or revoked. Performed at [facility], [place] [address] [place], [place] [address]          Radiology Studies: VAS [place] LOWER EXTREMITY VENOUS (DVT)  Result Date: 03/14/2020  Lower Venous DVTStudy Indications: Pain, and Recent cervical  fusion.  Comparison Study: No prior study Performing Technologist: Maudry Mayhew MHA, RDMS, RVT, RDCS  Examination Guidelines: A complete evaluation includes B-mode imaging, spectral Doppler, color Doppler, and power Doppler as needed of all accessible portions of each vessel. Bilateral testing is considered an integral part of a complete examination. Limited examinations for reoccurring indications may be performed as noted. The reflux portion of the exam is performed with the patient in reverse Trendelenburg.  +---------+---------------+---------+-----------+----------+--------------+ RIGHT    CompressibilityPhasicitySpontaneityPropertiesThrombus Aging +---------+---------------+---------+-----------+----------+--------------+ CFV      Full           Yes      Yes                                 +---------+---------------+---------+-----------+----------+--------------+ SFJ      Full                                                        +---------+---------------+---------+-----------+----------+--------------+ FV Prox  Full                                                        +---------+---------------+---------+-----------+----------+--------------+  FV Mid   Full                                                        +---------+---------------+---------+-----------+----------+--------------+ FV DistalFull                                                        +---------+---------------+---------+-----------+----------+--------------+ PFV      Full                                                        +---------+---------------+---------+-----------+----------+--------------+ POP      Full           Yes      Yes                                 +---------+---------------+---------+-----------+----------+--------------+ PTV      Full                                                         +---------+---------------+---------+-----------+----------+--------------+ PERO     Full                                                        +---------+---------------+---------+-----------+----------+--------------+ Gastroc  None                    No                   Acute          +---------+---------------+---------+-----------+----------+--------------+   +----+---------------+---------+-----------+----------+--------------+ LEFTCompressibilityPhasicitySpontaneityPropertiesThrombus Aging +----+---------------+---------+-----------+----------+--------------+ CFV Full           Yes      Yes                                 +----+---------------+---------+-----------+----------+--------------+     Summary: RIGHT: - Findings consistent with acute deep vein thrombosis involving the right gastrocnemius veins. - No cystic structure found in the popliteal fossa.  LEFT: - No evidence of common femoral vein obstruction.  *See table(s) above for measurements and observations. Electronically signed by Deitra Mayo MD on 03/14/2020 at 6:51:21 PM.    Final         Scheduled Meds: . apixaban  5 mg Oral BID  . buPROPion  150 mg Oral q AM  . docusate sodium  100 mg Oral BID  . gabapentin  200 mg Oral TID  . irbesartan  300 mg Oral Daily  . pantoprazole  40 mg Oral Daily  . pravastatin  20  mg Oral QHS  . tamsulosin  0.4 mg Oral Daily   Continuous Infusions: . heparin 1,650 Units/hr (03/16/20 0805)     LOS: 1 day   Time spent= 35 mins    Ailene Royal Arsenio Loader, MD [facility]  If 7PM-7AM, please contact night-coverage  03/16/2020, 11:06 AM

## 2020-03-16 NOTE — Progress Notes (Addendum)
ANTICOAGULATION CONSULT NOTE - Initial Consult  Pharmacy Consult for heparin Indication:  RLE gastrocnemius veins DVT   Patient Measurements: Height: 6' (182.9 cm) Weight: 107 kg (235 lb 14.3 oz) IBW/kg (Calculated) : 77.6 Heparin Dosing Weight: 100 kg  Vital Signs: Temp: 98.4 F (36.9 C) (04/29 0814) Temp Source: Oral (04/29 0814) BP: 142/94 (04/29 0814) Pulse Rate: 84 (04/29 0814)  Labs: Recent Labs    03/14/20 1656 03/14/20 1656 03/15/20 0304 03/15/20 0304 03/15/20 1454 03/15/20 2025 03/16/20 0214  HGB 14.6   < > 13.9  --   --   --  14.6  HCT 46.9  --  44.5  --   --   --  46.3  PLT 246  --  233  --   --   --  232  LABPROT 13.6  --   --   --   --   --   --   INR 1.1  --   --   --   --   --   --   HEPARINUNFRC  --   --  0.25*   < > 0.42 0.43 0.60  CREATININE 1.31*  --  1.20  --   --   --   --    < > = values in this interval not displayed.    Estimated Creatinine Clearance: 89 mL/min (by C-G formula based on SCr of 1.2 mg/dL).   Assessment: 54 yo M w/ acute RLE gastrocnemius vein DVTs 03/14/20 started on heparin. No bolus given and modified heparin goal due to recent cervical spine surgery 03/09/20.   Heparin supratherapeutic this morning at 0.60. H&H stable with no issues with infusion or bleeding reported per RN.  Goal of Therapy:  Heparin level 0.3- 0.5 units/ml  Monitor platelets by anticoagulation protocol: Yes   Plan:   Reduce heparin to 1650 units/hour 6-hour heparin level at 1400 Daily CBC and heparin level Monitor bleeding, CBC for 48 hrs, if stable ok to transition to PO anticoagulation per Neurosurgery      Ricky Gallery L. Devin Going, [place] PGY1 Pharmacy Resident [phone] 03/16/20      8:26 AM  Please check AMION for all [place] phone numbers After 10:00 PM, call the [place] [phone]    __________________________________________________ 03/16/20 0820 Addendum Plan: - stop heparin at 1800 - start apixaban 5 mg BID today  at 1800 - no apixaban 7-day load due to recent surgery   Dequavious Harshberger L. Devin Going, [place] PGY1 Pharmacy Resident [phone] 03/16/20     8:26 AM  Please check AMION for all [place] phone numbers After 10:00 PM, call the [place] [phone]

## 2020-03-16 NOTE — Discharge Instructions (Addendum)
Deep Vein Thrombosis  Deep vein thrombosis (DVT) is a condition in which a blood clot forms in a deep vein, such as a lower leg, thigh, or arm vein. A clot is blood that has thickened into a gel or solid. This condition is dangerous. It can lead to serious and even life-threatening complications if the clot travels to the lungs and causes a blockage (pulmonary embolism). It can also damage veins in the leg. This can result in leg pain, swelling, discoloration, and sores (post-thrombotic syndrome). What are the causes? This condition may be caused by:  A slowdown of blood flow.  Damage to a vein.  A condition that causes blood to clot more easily, such as an inherited clotting disorder. What increases the risk? The following factors may make you more likely to develop this condition:  Being overweight.  Being older, especially over age 60.  Sitting or lying down for more than four hours.  Being in the hospital.  Lack of physical activity (sedentary lifestyle).  Pregnancy, being in childbirth, or having recently given birth.  Taking medicines that contain estrogen, such as medicines to prevent pregnancy.  Smoking.  A history of any of the following: ? Blood clots or a blood clotting disease. ? Peripheral vascular disease. ? Inflammatory bowel disease. ? Cancer. ? Heart disease. ? Genetic conditions that affect how your blood clots, such as Factor V Leiden mutation. ? Neurological diseases that affect your legs (leg paresis). ? A recent injury, such as a car accident. ? Major or lengthy surgery. ? A central line placed inside a large vein. What are the signs or symptoms? Symptoms of this condition include:  Swelling, pain, or tenderness in an arm or leg.  Warmth, redness, or discoloration in an arm or leg. If the clot is in your leg, symptoms may be more noticeable or worse when you stand or walk. Some people may not develop any symptoms. How is this diagnosed? This  condition is diagnosed with:  A medical history and physical exam.  Tests, such as: ? Blood tests. These are done to check how well your blood clots. ? Ultrasound. This is done to check for clots. ? Venogram. For this test, contrast dye is injected into a vein and X-rays are taken to check for any clots. How is this treated? Treatment for this condition depends on:  The cause of your DVT.  Your risk for bleeding or developing more clots.  Any other medical conditions that you have. Treatment may include:  Taking a blood thinner (anticoagulant). This type of medicine prevents clots from forming. It may be taken by mouth, injected under the skin, or injected through an IV (catheter).  Injecting clot-dissolving medicines into the affected vein (catheter-directed thrombolysis).  Having surgery. Surgery may be done to: ? Remove the clot. ? Place a filter in a large vein to catch blood clots before they reach the lungs. Some treatments may be continued for up to six months. Follow these instructions at home: If you are taking blood thinners:  Take the medicine exactly as told by your health care provider. Some blood thinners need to be taken at the same time every day. Do not skip a dose.  Talk with your health care provider before you take any medicines that contain aspirin or NSAIDs. These medicines increase your risk for dangerous bleeding.  Ask your health care provider about foods and drugs that could change the way the medicine works (may interact). Avoid those things if your   health care provider tells you to do so.  Blood thinners can cause easy bruising and may make it difficult to stop bleeding. Because of this: ? Be very careful when using knives, scissors, or other sharp objects. ? Use an electric razor instead of a blade. ? Avoid activities that could cause injury or bruising, and follow instructions about how to prevent falls.  Wear a medical alert bracelet or carry a  card that lists what medicines you take. General instructions  Take over-the-counter and prescription medicines only as told by your health care provider.  Return to your normal activities as told by your health care provider. Ask your health care provider what activities are safe for you.  Wear compression stockings if recommended by your health care provider.  Keep all follow-up visits as told by your health care provider. This is important. How is this prevented? To lower your risk of developing this condition again:  For 30 or more minutes every day, do an activity that: ? Involves moving your arms and legs. ? Increases your heart rate.  When traveling for longer than four hours: ? Exercise your arms and legs every hour. ? Drink plenty of water. ? Avoid drinking alcohol.  Avoid sitting or lying for a long time without moving your legs.  If you have surgery or you are hospitalized, ask about ways to prevent blood clots. These may include taking frequent walks or using anticoagulants.  Stay at a healthy weight.  If you are a woman who is older than age 64, avoid unnecessary use of medicines that contain estrogen, such as some birth control pills.  Do not use any products that contain nicotine or tobacco, such as cigarettes and e-cigarettes. This is especially important if you take estrogen medicines. If you need help quitting, ask your health care provider. Contact a health care provider if:  You miss a dose of your blood thinner.  Your menstrual period is heavier than usual.  You have unusual bruising. Get help right away if:  You have: ? New or increased pain, swelling, or redness in an arm or leg. ? Numbness or tingling in an arm or leg. ? Shortness of breath. ? Chest pain. ? A rapid or irregular heartbeat. ? A severe headache or confusion. ? A cut that will not stop bleeding.  There is blood in your vomit, stool, or urine.  You have a serious fall or accident,  or you hit your head.  You feel light-headed or dizzy.  You cough up blood. These symptoms may represent a serious problem that is an emergency. Do not wait to see if the symptoms will go away. Get medical help right away. Call your local emergency services (911 in the [place] Do not drive yourself to the hospital. Summary  Deep vein thrombosis (DVT) is a condition in which a blood clot forms in a deep vein, such as a lower leg, thigh, or arm vein.  Symptoms can include swelling, warmth, pain, and redness in your leg or arm.  This condition may be treated with a blood thinner (anticoagulant medicine), medicine that is injected to dissolve blood clots,compression stockings, or surgery.  If you are prescribed blood thinners, take them exactly as told. This information is not intended to replace advice given to you by your health care provider. Make sure you discuss any questions you have with your health care provider. Document Revised: 10/17/2017 Document Reviewed: 04/04/2017 Elsevier Patient Education  [place]  on my medicine - ELIQUIS (apixaban)  This medication education was reviewed with me or my healthcare representative as part of my discharge preparation.    Why was Eliquis prescribed for you? Eliquis was prescribed for you to reduce the risk of forming blood clots that can cause a stroke if you have a medical condition called atrial fibrillation (a type of irregular heartbeat) OR to reduce the risk of a blood clots forming after orthopedic surgery.  What do You need to know about Eliquis ? Take your Eliquis TWICE DAILY - one tablet in the morning and one tablet in the evening with or without food.  It would be best to take the doses about the same time each day.  If you have difficulty swallowing the tablet whole please discuss with your pharmacist how to take the medication safely.  Take Eliquis exactly as prescribed by your doctor and DO NOT  stop taking Eliquis without talking to the doctor who prescribed the medication.  Stopping may increase your risk of developing a new clot or stroke.  Refill your prescription before you run out.  After discharge, you should have regular check-up appointments with your healthcare provider that is prescribing your Eliquis.  In the future your dose may need to be changed if your kidney function or weight changes by a significant amount or as you get older.  What do you do if you miss a dose? If you miss a dose, take it as soon as you remember on the same day and resume taking twice daily.  Do not take more than one dose of ELIQUIS at the same time.  Important Safety Information A possible side effect of Eliquis is bleeding. You should call your healthcare provider right away if you experience any of the following: ? Bleeding from an injury or your nose that does not stop. ? Unusual colored urine (red or dark brown) or unusual colored stools (red or black). ? Unusual bruising for unknown reasons. ? A serious fall or if you hit your head (even if there is no bleeding).  Some medicines may interact with Eliquis and might increase your risk of bleeding or clotting while on Eliquis. To help avoid this, consult your healthcare provider or pharmacist prior to using any new prescription or non-prescription medications, including herbals, vitamins, non-steroidal anti-inflammatory drugs (NSAIDs) and supplements.  This website has more information on Eliquis (apixaban): [email or web address].

## 2020-03-16 NOTE — Progress Notes (Signed)
Subjective: The patient is alert and pleasant.  He looks and feels better.  His wife is at the bedside.  Objective: Vital signs in last 24 hours: Temp:  [97.8 F (36.6 C)-99.1 F (37.3 C)] 97.8 F (36.6 C) (04/29 0452) Pulse Rate:  [69-84] 69 (04/29 0452) Resp:  [14-25] 14 (04/29 0452) BP: (129-149)/(82-101) 129/82 (04/29 0452) SpO2:  [93 %-96 %] 95 % (04/29 0452) Estimated body mass index is 31.99 kg/m as calculated from the following:   Height as of this encounter: 6' (1.829 m).   Weight as of this encounter: 107 kg.   Intake/Output from previous day: No intake/output data recorded. Intake/Output this shift: No intake/output data recorded.  Physical exam the patient is alert and pleasant.  He is moving all 4 extremities well.  His wound is without change, without hematoma, shift, etc.  Lab Results: Recent Labs    03/15/20 0304 03/16/20 0214  WBC 9.0 11.4*  HGB 13.9 14.6  HCT 44.5 46.3  PLT 233 232   BMET Recent Labs    03/14/20 1656 03/15/20 0304  NA 141 139  K 4.6 3.7  CL 102 99  CO2 32 28  GLUCOSE 112* 108*  BUN 13 12  CREATININE 1.31* 1.20  CALCIUM 9.4 9.2    Studies/Results: VAS [place] LOWER EXTREMITY VENOUS (DVT)  Result Date: 03/14/2020  Lower Venous DVTStudy Indications: Pain, and Recent cervical fusion.  Comparison Study: No prior study Performing Technologist: Maudry Mayhew MHA, RDMS, RVT, RDCS  Examination Guidelines: A complete evaluation includes B-mode imaging, spectral Doppler, color Doppler, and power Doppler as needed of all accessible portions of each vessel. Bilateral testing is considered an integral part of a complete examination. Limited examinations for reoccurring indications may be performed as noted. The reflux portion of the exam is performed with the patient in reverse Trendelenburg.  +---------+---------------+---------+-----------+----------+--------------+ RIGHT    CompressibilityPhasicitySpontaneityPropertiesThrombus Aging  +---------+---------------+---------+-----------+----------+--------------+ CFV      Full           Yes      Yes                                 +---------+---------------+---------+-----------+----------+--------------+ SFJ      Full                                                        +---------+---------------+---------+-----------+----------+--------------+ FV Prox  Full                                                        +---------+---------------+---------+-----------+----------+--------------+ FV Mid   Full                                                        +---------+---------------+---------+-----------+----------+--------------+ FV DistalFull                                                        +---------+---------------+---------+-----------+----------+--------------+  PFV      Full                                                        +---------+---------------+---------+-----------+----------+--------------+ POP      Full           Yes      Yes                                 +---------+---------------+---------+-----------+----------+--------------+ PTV      Full                                                        +---------+---------------+---------+-----------+----------+--------------+ PERO     Full                                                        +---------+---------------+---------+-----------+----------+--------------+ Gastroc  None                    No                   Acute          +---------+---------------+---------+-----------+----------+--------------+   +----+---------------+---------+-----------+----------+--------------+ LEFTCompressibilityPhasicitySpontaneityPropertiesThrombus Aging +----+---------------+---------+-----------+----------+--------------+ CFV Full           Yes      Yes                                  +----+---------------+---------+-----------+----------+--------------+     Summary: RIGHT: - Findings consistent with acute deep vein thrombosis involving the right gastrocnemius veins. - No cystic structure found in the popliteal fossa.  LEFT: - No evidence of common femoral vein obstruction.  *See table(s) above for measurements and observations. Electronically signed by Deitra Mayo MD on 03/14/2020 at 6:51:21 PM.    Final     Assessment/Plan: Postop day #7: The patient is doing well neurologically.  Right lower extremity DVT: He is being anticoagulated with heparin.  The plan is to convert him to oral agents tomorrow.  I have answered all her questions.  LOS: 1 day     Ophelia Charter 03/16/2020, 8:10 AM

## 2020-03-16 NOTE — Progress Notes (Signed)
Patient and wife aware of co-pay requirement for Eliquis.  At 35.00.

## 2020-03-16 NOTE — TOC Benefit Eligibility Note (Signed)
Transition of Care [facility]) Benefit Eligibility Note    Patient Details  Name: Tom Wilkerson MRN: [id number] Date of Birth: 06-Mar-1966   Medication/Dose: Arne Cleveland  5 MG BNID  Covered?: Yes  Tier: 2 Drug  Prescription Coverage [facility]erred [facility]  and  [facility]  Spoke with Person/Company/Phone Number:: MOIRA  @ [employer] [place] # [phone]  Co-Pay: $35.00  Prior Approval: No  Deductible: Met       Memory Argue Phone Number: 03/16/2020, 10:58 AM

## 2020-03-16 NOTE — Progress Notes (Signed)
Eligibility check for eliquis sent

## 2020-03-17 LAB — CBC
HCT: 46.2 % (ref 39.0–52.0)
Hemoglobin: 14.4 g/dL (ref 13.0–17.0)
MCH: 25.4 pg — ABNORMAL LOW (ref 26.0–34.0)
MCHC: 31.2 g/dL (ref 30.0–36.0)
MCV: 81.3 fL (ref 80.0–100.0)
Platelets: 241 10*3/uL (ref 150–400)
RBC: 5.68 MIL/uL (ref 4.22–5.81)
RDW: 17.8 % — ABNORMAL HIGH (ref 11.5–15.5)
WBC: 10.6 10*3/uL — ABNORMAL HIGH (ref 4.0–10.5)
nRBC: 0 % (ref 0.0–0.2)

## 2020-03-17 MED ORDER — APIXABAN 5 MG PO TABS: 5.0000 mg | ORAL_TABLET | Freq: Two times a day (BID) | ORAL | Status: DC

## 2020-03-17 MED ORDER — APIXABAN 5 MG PO TABS: 5.0000 mg | ORAL_TABLET | Freq: Two times a day (BID) | ORAL | 0 refills | Status: DC

## 2020-03-17 MED FILL — ELIQUIS 5 MG TABLET: 5 | 30 days supply | Qty: 60 | Fill #0

## 2020-03-17 NOTE — Progress Notes (Signed)
Subjective:  The patient is alert and pleasant.  He looks and feels better.  He wants to go home.  His dysphagia has resolved.  Objective: Vital signs in last 24 hours: Temp:  [97.5 F (36.4 C)-98.5 F (36.9 C)] 97.9 F (36.6 C) (04/30 0427) Pulse Rate:  [71-99] 71 (04/30 0427) Resp:  [13-20] 15 (04/30 0427) BP: (116-143)/(78-94) 116/83 (04/30 0427) SpO2:  [94 %-100 %] 95 % (04/30 0427) Estimated body mass index is 31.99 kg/m as calculated from the following:   Height as of this encounter: 6' (1.829 m).   Weight as of this encounter: 107 kg.   Intake/Output from previous day: 04/29 0701 - 04/30 0700 In: 120 [P.O.:120] Out: -  Intake/Output this shift: No intake/output data recorded.  Physical exam   The patient is alert and oriented.  His strength is normal.  His wound  Is without change.  There is no significant hematoma or midline shift.  Lab Results: Recent Labs   03/16/20 0214 03/17/20 0209 WBC 11.4* 10.6* HGB 14.6 14.4 HCT 46.3 46.2 PLT 232 241  BMET Recent Labs   03/14/20 1656 03/15/20 0304 NA 141 139 K 4.6 3.7 CL 102 99 CO2 32 28 GLUCOSE 112* 108* BUN 13 12 CREATININE 1.31* 1.20 CALCIUM 9.4 9.2   Studies/Results: No results found.  Assessment/Plan:   Postop day 8.: The patient is doing well.      DVT : when the patient has been started on Eliquis.  He seems okay to be discharged to home and follow up with me in a week , if it is okay with the Hospitalist.  LOS: 2 days     Ophelia Charter 03/17/2020, 8:02 AM

## 2020-03-17 NOTE — Discharge Summary (Signed)
Physician Discharge Summary  Tom Wilkerson [id number] DOB: 06-Jan-1966 DOA: 03/14/2020  PCP: [name], MD  Admit date: 03/14/2020 Discharge date: 03/17/2020  Admitted From: Home Disposition: Home  Recommendations for Outpatient Follow-up:  1. Follow up with PCP in 1-2 weeks 2. Please obtain BMP/CBC in one week your next doctors visit.  3. Eliquis 5 mg twice daily 4. Follow-up outpatient neurosurgery in 1 week   Discharge Condition: Stable CODE STATUS: Full code Diet recommendation: Heart healthy  Brief/Interim Summary: 54 y.o.malewith medical history significant forhypertension, OSA on CPAP, CKD stage III, hyperlipidemia who presents right calf pain and findings of DVT.Patient underwent cervical anterior discectomy/decompression on 4/23 byneurosurgereon Dr. Arnoldo Morale.  Admitted to the hospital started on heparin drip.    After 48 hours patient was transitioned from heparin drip to Eliquis.  Tolerated this well.  Safe for discharge today.   Assessment & Plan:    Acute right gastrocnemius vein DVT After 48 hours patient has been transitioned from heparin drip to Eliquis, doing well no complaints.  Cervical discectomy/decompression Seen by neurosurgery, continue C-spine brace.  Rest of the medications per their service.  Pain control.  Outpatient follow-up in 1 week  Hypertension Continue antihypertensives  CKD stage II Stable Avoid nephrotoxic agent  OSA Continue CPAP  Hyperlipidemia Continue statin   Discharge Diagnoses:  Principal Problem:   Deep vein blood clot of right lower extremity (HCC) Active Problems:   Dyslipidemia   CKD (chronic kidney disease) stage 2, GFR 60-89 ml/min   OSA on CPAP   H/O cervical spine surgery   DVT (deep venous thrombosis) ([place])    Consultations:  Neurosurgery  Subjective: Feels great no complaints.  Wife at bedside.  All the questions answered in detail  Discharge Exam: Vitals:   03/17/20 0427 03/17/20  0855  BP: 116/83 (!) 141/77  Pulse: 71 91  Resp: 15 19  Temp: 97.9 F (36.6 C) 97.7 F (36.5 C)  SpO2: 95% 97%   Vitals:   03/16/20 2042 03/17/20 0034 03/17/20 0427 03/17/20 0855  BP: (!) 143/85 132/89 116/83 (!) 141/77  Pulse: 75 73 71 91  Resp: 13 17 15 19   Temp: 97.7 F (36.5 C) 97.7 F (36.5 C) 97.9 F (36.6 C) 97.7 F (36.5 C)  TempSrc: Oral Oral Oral Oral  SpO2: 94% 98% 95% 97%  Weight:      Height:        General: Pt is alert, awake, not in acute distress, c-collar in place Cardiovascular: RRR, S1/S2 +, no rubs, no gallops Respiratory: CTA bilaterally, no wheezing, no rhonchi Abdominal: Soft, NT, ND, bowel sounds + Extremities: no edema, no cyanosis  Discharge Instructions   Allergies as of 03/17/2020      Reactions   Ibuprofen Itching, Swelling, Other (See Comments)   Facial swelling   Other Other (See Comments)   NO "BROAD SPECTRUM ABX - ONLY "TARGETED" ONES, DUE TO C-DIFF THAT "ALMOST KILLED HIM" (had a fecal transplant)   Bee Venom Swelling, Other (See Comments)   Extreme localized swelling from bee stings   Codeine    Insomnia and Hyperactivity   Nsaids Swelling, Other (See Comments)   Facial swelling   Amlodipine Other (See Comments)   Dizziness      Medication List    TAKE these medications   acetaminophen 500 MG tablet Commonly known as: TYLENOL Take 1,000 mg by mouth every 6 (six) hours as needed for mild pain or headache.   AMBULATORY NON FORMULARY MEDICATION Patient has  OSA or probable OSA (Yes or No): yes Is the patient currently using CPAP within the home? (Yes or No): yes If yes to question 2, what is the current DME provider?  Are there any changes to the order/settings? (if yes, please specify) no If no to question 2, date of sleep study: 10/22/17 Date of face-to-face encounter: 01/13/2018 Settings: auto 5-20 cm h2O Signs and symptoms or probable OSA, mention all that apply (snoring) (morning  Headaches) (witnessed apneas)  (choking) (gasping during sleep): snoring, apnea Resmed S/O air/auto with heated humidity.   Enroll in [employer] CPAP supplies needed: mask of choice, headgear, cushions, filters, climate control tubing and water chamber.   apixaban 5 MG Tabs tablet Commonly known as: ELIQUIS Take 1 tablet (5 mg total) by mouth 2 (two) times daily.   buPROPion 150 MG 24 hr tablet Commonly known as: WELLBUTRIN XL TAKE ONE TABLET BY MOUTH EVERY DAY What changed: when to take this   CoQ-10 100 MG Caps Take 100 mg by mouth at bedtime.   cyclobenzaprine 10 MG tablet Commonly known as: FLEXERIL Take 1 tablet (10 mg total) by mouth 3 (three) times daily as needed for muscle spasms. What changed: when to take this   docusate sodium 100 MG capsule Commonly known as: COLACE Take 1 capsule (100 mg total) by mouth 2 (two) times daily.   fluticasone 50 MCG/ACT nasal spray Commonly known as: FLONASE Place 1 spray into both nostrils 2 (two) times daily as needed for allergies or rhinitis.   gabapentin 100 MG capsule Commonly known as: NEURONTIN Take 2 capsules (200 mg total) by mouth at bedtime. What changed:   how much to take  when to take this  additional instructions   GenTeal 0.25-0.3 % Gel Generic drug: Carboxymethylcell-Hypromellose Place 1 application into both eyes 2 (two) times daily as needed (for dryness).   omeprazole 40 MG capsule Commonly known as: PRILOSEC Take 40 mg by mouth daily before supper.   Oxycodone HCl 10 MG Tabs Take 1 tablet (10 mg total) by mouth every 4 (four) hours as needed for severe pain ((score 7 to 10)). What changed: when to take this   pravastatin 20 MG tablet Commonly known as: PRAVACHOL TAKE 1 TABLET EVERY OTHER DAY AT BEDTIME   PRESCRIPTION MEDICATION CPAP- At bedtime   PROBIOTIC PO Take 1 capsule by mouth daily.   sodium chloride 0.65 % Soln nasal spray Commonly known as: OCEAN Place 1 spray into both nostrils as needed  for congestion.   tamsulosin 0.4 MG Caps capsule Commonly known as: FLOMAX Take 0.4 mg by mouth daily.   Tart Cherry Advanced Caps Take 2 capsules by mouth every evening.   testosterone cypionate 200 MG/ML injection Commonly known as: DEPOTESTOSTERONE CYPIONATE Inject 140 mg into the muscle every Tuesday. At night   valsartan 320 MG tablet Commonly known as: DIOVAN Take 320 mg by mouth at bedtime.      Follow-up Information    [name], MD. Schedule an appointment as soon as possible for a visit in 2 week(s).   Specialty: Family Medicine Contact information: [place] [address] [phone]          Allergies  Allergen Reactions  . Ibuprofen Itching, Swelling and Other (See Comments)    Facial swelling   . Other Other (See Comments)    NO "BROAD SPECTRUM ABX - ONLY "TARGETED" ONES, DUE TO C-DIFF THAT "ALMOST KILLED HIM" (had a fecal transplant)  .  Bee Venom Swelling and Other (See Comments)    Extreme localized swelling from bee stings   . Codeine     Insomnia and Hyperactivity  . Nsaids Swelling and Other (See Comments)    Facial swelling  . Amlodipine Other (See Comments)    Dizziness     You were cared for by a hospitalist during your hospital stay. If you have any questions about your discharge medications or the care you received while you were in the hospital after you are discharged, you can call the unit and asked to speak with the hospitalist on call if the hospitalist that took care of you is not available. Once you are discharged, your primary care physician will handle any further medical issues. Please note that no refills for any discharge medications will be authorized once you are discharged, as it is imperative that you return to your primary care physician (or establish a relationship with a primary care physician if you do not have one) for your aftercare needs so that they can reassess your need for medications and  monitor your lab values.   Procedures/Studies: DG Cervical Spine 2-3 Views  Result Date: 03/09/2020 CLINICAL DATA:  Operative imaging for anterior cervical decompression/discectomy and fusion. EXAM: CERVICAL SPINE - 2-3 VIEW COMPARISON:  01/17/2020. FINDINGS: Two submitted images. Additional image shows placement of a surgical needle with its tip projecting over the C3-C4 disc. Follow-up image shows placement a fixation plate and associated screws spanning C3 through C7. The orthopedic hardware appears well seated. IMPRESSION: Well-positioned orthopedic hardware following anterior cervical fusion, C3 through C7. Electronically Signed   By: Lajean Manes M.D.   On: 03/09/2020 14:29   VAS [place] LOWER EXTREMITY VENOUS (DVT)  Result Date: 03/14/2020  Lower Venous DVTStudy Indications: Pain, and Recent cervical fusion.  Comparison Study: No prior study Performing Technologist: Maudry Mayhew MHA, RDMS, RVT, RDCS  Examination Guidelines: A complete evaluation includes B-mode imaging, spectral Doppler, color Doppler, and power Doppler as needed of all accessible portions of each vessel. Bilateral testing is considered an integral part of a complete examination. Limited examinations for reoccurring indications may be performed as noted. The reflux portion of the exam is performed with the patient in reverse Trendelenburg.  +---------+---------------+---------+-----------+----------+--------------+ RIGHT    CompressibilityPhasicitySpontaneityPropertiesThrombus Aging +---------+---------------+---------+-----------+----------+--------------+ CFV      Full           Yes      Yes                                 +---------+---------------+---------+-----------+----------+--------------+ SFJ      Full                                                        +---------+---------------+---------+-----------+----------+--------------+ FV Prox  Full                                                         +---------+---------------+---------+-----------+----------+--------------+ FV Mid   Full                                                        +---------+---------------+---------+-----------+----------+--------------+  FV DistalFull                                                        +---------+---------------+---------+-----------+----------+--------------+ PFV      Full                                                        +---------+---------------+---------+-----------+----------+--------------+ POP      Full           Yes      Yes                                 +---------+---------------+---------+-----------+----------+--------------+ PTV      Full                                                        +---------+---------------+---------+-----------+----------+--------------+ PERO     Full                                                        +---------+---------------+---------+-----------+----------+--------------+ Gastroc  None                    No                   Acute          +---------+---------------+---------+-----------+----------+--------------+   +----+---------------+---------+-----------+----------+--------------+ LEFTCompressibilityPhasicitySpontaneityPropertiesThrombus Aging +----+---------------+---------+-----------+----------+--------------+ CFV Full           Yes      Yes                                 +----+---------------+---------+-----------+----------+--------------+     Summary: RIGHT: - Findings consistent with acute deep vein thrombosis involving the right gastrocnemius veins. - No cystic structure found in the popliteal fossa.  LEFT: - No evidence of common femoral vein obstruction.  *See table(s) above for measurements and observations. Electronically signed by Deitra Mayo MD on 03/14/2020 at 6:51:21 PM.    Final       The results of significant diagnostics from this hospitalization  (including imaging, microbiology, ancillary and laboratory) are listed below for reference.     Microbiology: Recent Results (from the past 240 hour(s))  Surgical pcr screen     Status: None   Collection Time: 03/07/20 11:35 AM   Specimen: Nasal Mucosa; Nasal Swab  Result Value Ref Range Status   MRSA, PCR NEGATIVE NEGATIVE Final   Staphylococcus aureus NEGATIVE NEGATIVE Final    Comment: (NOTE) The Xpert SA Assay (FDA approved for NASAL specimens in patients 69 years of age and older), is one component of a comprehensive surveillance program. It is not intended to diagnose infection nor to guide or monitor treatment. Performed  at [facility], [place] [address] [place], [place] [address]   SARS CORONAVIRUS 2 (TAT 6-24 HRS) Nasopharyngeal Nasopharyngeal Swab     Status: None   Collection Time: 03/07/20 11:37 AM   Specimen: Nasopharyngeal Swab  Result Value Ref Range Status   SARS Coronavirus 2 NEGATIVE NEGATIVE Final    Comment: (NOTE) SARS-CoV-2 target nucleic acids are NOT DETECTED. The SARS-CoV-2 RNA is generally detectable in upper and lower respiratory specimens during the acute phase of infection. Negative results do not preclude SARS-CoV-2 infection, do not rule out co-infections with other pathogens, and should not be used as the sole basis for treatment or other patient management decisions. Negative results must be combined with clinical observations, patient history, and epidemiological information. The expected result is Negative. Fact Sheet for Patients: SugarRoll.be Fact Sheet for Healthcare Providers: https://www.woods-mathews.com/ This test is not yet approved or cleared by the [place] FDA and  has been authorized for detection and/or diagnosis of SARS-CoV-2 by FDA under an Emergency Use Authorization (EUA). This EUA will remain  in effect (meaning this test can be used) for the duration of the COVID-19  declaration under Section 56 4(b)(1) of the Act, 21 U.S.C. section 360bbb-3(b)(1), unless the authorization is terminated or revoked sooner. Performed at [facility], [place] [address] [place], [place] [address]   Respiratory Panel by RT PCR (Flu A&B, Covid) - Nasopharyngeal Swab     Status: None   Collection Time: 03/14/20  5:24 PM   Specimen: Nasopharyngeal Swab  Result Value Ref Range Status   SARS Coronavirus 2 by RT PCR NEGATIVE NEGATIVE Final    Comment: (NOTE) SARS-CoV-2 target nucleic acids are NOT DETECTED. The SARS-CoV-2 RNA is generally detectable in upper respiratoy specimens during the acute phase of infection. The lowest concentration of SARS-CoV-2 viral copies this assay can detect is 131 copies/mL. A negative result does not preclude SARS-Cov-2 infection and should not be used as the sole basis for treatment or other patient management decisions. A negative result may occur with  improper specimen collection/handling, submission of specimen other than nasopharyngeal swab, presence of viral mutation(s) within the areas targeted by this assay, and inadequate number of viral copies (<131 copies/mL). A negative result must be combined with clinical observations, patient history, and epidemiological information. The expected result is Negative. Fact Sheet for Patients:  PinkCheek.be Fact Sheet for Healthcare Providers:  GravelBags.it This test is not yet ap proved or cleared by the [place] FDA and  has been authorized for detection and/or diagnosis of SARS-CoV-2 by FDA under an Emergency Use Authorization (EUA). This EUA will remain  in effect (meaning this test can be used) for the duration of the COVID-19 declaration under Section 564(b)(1) of the Act, 21 U.S.C. section 360bbb-3(b)(1), unless the authorization is terminated or revoked sooner.    Influenza A by PCR NEGATIVE NEGATIVE Final   Influenza  B by PCR NEGATIVE NEGATIVE Final    Comment: (NOTE) The Xpert Xpress SARS-CoV-2/FLU/RSV assay is intended as an aid in  the diagnosis of influenza from Nasopharyngeal swab specimens and  should not be used as a sole basis for treatment. Nasal washings and  aspirates are unacceptable for Xpert Xpress SARS-CoV-2/FLU/RSV  testing. Fact Sheet for Patients: PinkCheek.be Fact Sheet for Healthcare Providers: GravelBags.it This test is not yet approved or cleared by the [place] FDA and  has been authorized for detection and/or diagnosis of SARS-CoV-2 by  FDA under an Emergency Use Authorization (EUA). This EUA  will remain  in effect (meaning this test can be used) for the duration of the  Covid-19 declaration under Section 564(b)(1) of the Act, 21  U.S.C. section 360bbb-3(b)(1), unless the authorization is  terminated or revoked. Performed at [facility], [place] [address] [place], [place] [address]      Labs: BNP (last 3 results) No results for input(s): BNP in the last 8760 hours. Basic Metabolic Panel: Recent Labs  Lab 03/14/20 1656 03/15/20 0304  NA 141 139  K 4.6 3.7  CL 102 99  CO2 32 28  GLUCOSE 112* 108*  BUN 13 12  CREATININE 1.31* 1.20  CALCIUM 9.4 9.2   Liver Function Tests: No results for input(s): AST, ALT, ALKPHOS, BILITOT, PROT, ALBUMIN in the last 168 hours. No results for input(s): LIPASE, AMYLASE in the last 168 hours. No results for input(s): AMMONIA in the last 168 hours. CBC: Recent Labs  Lab 03/14/20 1656 03/15/20 0304 03/16/20 0214 03/17/20 0209  WBC 9.5 9.0 11.4* 10.6*  HGB 14.6 13.9 14.6 14.4  HCT 46.9 44.5 46.3 46.2  MCV 82.1 81.2 80.7 81.3  PLT 246 233 232 241   Cardiac Enzymes: No results for input(s): CKTOTAL, CKMB, CKMBINDEX, TROPONINI in the last 168 hours. BNP: Invalid input(s): POCBNP CBG: No results for input(s): GLUCAP in the last 168 hours. D-Dimer No results  for input(s): DDIMER in the last 72 hours. Hgb A1c No results for input(s): HGBA1C in the last 72 hours. Lipid Profile No results for input(s): CHOL, HDL, LDLCALC, TRIG, CHOLHDL, LDLDIRECT in the last 72 hours. Thyroid function studies No results for input(s): TSH, T4TOTAL, T3FREE, THYROIDAB in the last 72 hours.  Invalid input(s): FREET3 Anemia work up No results for input(s): VITAMINB12, FOLATE, FERRITIN, TIBC, IRON, RETICCTPCT in the last 72 hours. Urinalysis    Component Value Date/Time   COLORURINE Straw 10/04/2013 1134   APPEARANCEUR Clear 10/04/2013 1134   LABSPEC 1.002 10/04/2013 1134   PHURINE 7.0 10/04/2013 1134   GLUCOSEU Negative 10/04/2013 1134   HGBUR Negative 10/04/2013 1134   BILIRUBINUR Negative 10/04/2013 1134   KETONESUR Negative 10/04/2013 1134   PROTEINUR Negative 10/04/2013 1134   NITRITE Negative 10/04/2013 1134   LEUKOCYTESUR Negative 10/04/2013 1134   Sepsis Labs Invalid input(s): PROCALCITONIN,  WBC,  LACTICIDVEN Microbiology Recent Results (from the past 240 hour(s))  Surgical pcr screen     Status: None   Collection Time: 03/07/20 11:35 AM   Specimen: Nasal Mucosa; Nasal Swab  Result Value Ref Range Status   MRSA, PCR NEGATIVE NEGATIVE Final   Staphylococcus aureus NEGATIVE NEGATIVE Final    Comment: (NOTE) The Xpert SA Assay (FDA approved for NASAL specimens in patients 54 years of age and older), is one component of a comprehensive surveillance program. It is not intended to diagnose infection nor to guide or monitor treatment. Performed at [facility], [place] [address], [place], [place] [address]   SARS CORONAVIRUS 2 (TAT 6-24 HRS) Nasopharyngeal Nasopharyngeal Swab     Status: None   Collection Time: 03/07/20 11:37 AM   Specimen: Nasopharyngeal Swab  Result Value Ref Range Status   SARS Coronavirus 2 NEGATIVE NEGATIVE Final    Comment: (NOTE) SARS-CoV-2 target nucleic acids are NOT DETECTED. The SARS-CoV-2 RNA is generally  detectable in upper and lower respiratory specimens during the acute phase of infection. Negative results do not preclude SARS-CoV-2 infection, do not rule out co-infections with other pathogens, and should not be used as the sole basis for  treatment or other patient management decisions. Negative results must be combined with clinical observations, patient history, and epidemiological information. The expected result is Negative. Fact Sheet for Patients: SugarRoll.be Fact Sheet for Healthcare Providers: https://www.woods-mathews.com/ This test is not yet approved or cleared by the [place] FDA and  has been authorized for detection and/or diagnosis of SARS-CoV-2 by FDA under an Emergency Use Authorization (EUA). This EUA will remain  in effect (meaning this test can be used) for the duration of the COVID-19 declaration under Section 56 4(b)(1) of the Act, 21 U.S.C. section 360bbb-3(b)(1), unless the authorization is terminated or revoked sooner. Performed at [facility], [place] [address] [place], [place] [address]   Respiratory Panel by RT PCR (Flu A&B, Covid) - Nasopharyngeal Swab     Status: None   Collection Time: 03/14/20  5:24 PM   Specimen: Nasopharyngeal Swab  Result Value Ref Range Status   SARS Coronavirus 2 by RT PCR NEGATIVE NEGATIVE Final    Comment: (NOTE) SARS-CoV-2 target nucleic acids are NOT DETECTED. The SARS-CoV-2 RNA is generally detectable in upper respiratoy specimens during the acute phase of infection. The lowest concentration of SARS-CoV-2 viral copies this assay can detect is 131 copies/mL. A negative result does not preclude SARS-Cov-2 infection and should not be used as the sole basis for treatment or other patient management decisions. A negative result may occur with  improper specimen collection/handling, submission of specimen other than nasopharyngeal swab, presence of viral mutation(s) within  the areas targeted by this assay, and inadequate number of viral copies (<131 copies/mL). A negative result must be combined with clinical observations, patient history, and epidemiological information. The expected result is Negative. Fact Sheet for Patients:  PinkCheek.be Fact Sheet for Healthcare Providers:  GravelBags.it This test is not yet ap proved or cleared by the [place] FDA and  has been authorized for detection and/or diagnosis of SARS-CoV-2 by FDA under an Emergency Use Authorization (EUA). This EUA will remain  in effect (meaning this test can be used) for the duration of the COVID-19 declaration under Section 564(b)(1) of the Act, 21 U.S.C. section 360bbb-3(b)(1), unless the authorization is terminated or revoked sooner.    Influenza A by PCR NEGATIVE NEGATIVE Final   Influenza B by PCR NEGATIVE NEGATIVE Final    Comment: (NOTE) The Xpert Xpress SARS-CoV-2/FLU/RSV assay is intended as an aid in  the diagnosis of influenza from Nasopharyngeal swab specimens and  should not be used as a sole basis for treatment. Nasal washings and  aspirates are unacceptable for Xpert Xpress SARS-CoV-2/FLU/RSV  testing. Fact Sheet for Patients: PinkCheek.be Fact Sheet for Healthcare Providers: GravelBags.it This test is not yet approved or cleared by the [place] FDA and  has been authorized for detection and/or diagnosis of SARS-CoV-2 by  FDA under an Emergency Use Authorization (EUA). This EUA will remain  in effect (meaning this test can be used) for the duration of the  Covid-19 declaration under Section 564(b)(1) of the Act, 21  U.S.C. section 360bbb-3(b)(1), unless the authorization is  terminated or revoked. Performed at [facility], [place] [address] [place], [place] [address]      Time coordinating discharge:  I have spent 35 minutes face to  face with the patient and on the ward discussing the patients care, assessment, plan and disposition with other care givers. >50% of the time was devoted counseling the patient about the risks and benefits of treatment/Discharge disposition and coordinating care.   SIGNED:  Cordney Barstow Arsenio Loader, MD  [facility] 03/17/2020, 10:53 AM   If 7PM-7AM, please contact night-coverage

## 2020-03-17 NOTE — Progress Notes (Signed)
Nutrition Education Note  RD consulted for nutrition education regarding food and medications interactions with blood thinners.  Plan for discharge home today.   Spoke with pt, who was sitting in recliner chair. He was pleasant and in good spirits, looking forward to going home today. He reports good appetite and has been doing more home cooking secondary to COVID-19 pandemic, focusing on restricting added sodium to food.   Pt acknowledges that he will be discharge home on Eliquis, which does not have food/nutrient interactions. He recall visit with pharmacist yesterday, which he founds helpful and had no additional questions for this RD. RD explained that if prescription were to change and pt was transitioned to a anticoagulant with a vitamin K drug and nutrient interaction, consistent intake of vitamin K was recommend (pt able to identify high vitamin K foods). He had no questions for RD, but appreciative of visit.   Expect good compliance.  Body mass index is 31.99 kg/m.   Current diet order is heart healthy, patient is consuming approximately 100% of meals at this time. Labs and medications reviewed. No further nutrition interventions warranted at this time. RD contact information provided. If additional nutrition issues arise, please re-consult RD.   Tom Wilkerson, RD, LDN, [place] Registered Dietitian II Certified Diabetes Care and Education Specialist Please refer to [facility] for RD and/or RD on-call/weekend/after hours pager

## 2020-03-17 NOTE — Progress Notes (Signed)
03/17/2020 1040 Discharge AVS meds taken today and those due this evening reviewed.  Follow-up appointments and when to call md reviewed.  D/C IV and TELE.  Questions and concerns addressed.   D/C home per orders. [place]

## 2020-03-17 NOTE — Progress Notes (Signed)
Patient has home CPAP for use. RT will continue to monitor.

## 2020-04-19 ENCOUNTER — Encounter: Payer: Self-pay | Admitting: Urology

## 2020-04-19 ENCOUNTER — Ambulatory Visit (INDEPENDENT_AMBULATORY_CARE_PROVIDER_SITE_OTHER): Payer: 59 | Admitting: Urology

## 2020-04-19 ENCOUNTER — Telehealth: Payer: Self-pay

## 2020-04-19 ENCOUNTER — Other Ambulatory Visit: Payer: Self-pay

## 2020-04-19 VITALS — BP 130/83 | HR 71 | Ht 72.0 in | Wt 238.0 lb

## 2020-04-19 DIAGNOSIS — E349 Endocrine disorder, unspecified: Secondary | ICD-10-CM | POA: Diagnosis not present

## 2020-04-19 NOTE — Progress Notes (Signed)
04/18/20 8:23 AM   Tom Wilkerson [birth year] [id number]  Referring provider: [name], [name],  [place] [id number] Chief Complaint  Patient presents with  . testosterone     HPI: Tom Wilkerson is a 54 y.o. male who presents today for the to establish care for management of low testosterone.  -Patient of Dr. [name] and most recently seen at [name] last month -Developed DVT after cervical spine surgery April 20/21 and testosterone was discontinued -PCP has recommended holding testosterone -History of erythrocytosis secondary to TRT previously donated blood however now unable to donate due to being placed on Eliquis -Has noted worsening tiredness, fatigue and wanted to discuss any potential alternatives -Has not been seen by hematology but has an appointment with a [facility] hematologist next week.  -After his recent hospitalization and did note increased depression and anxiety and placed on Wellbutrin and sertraline by his PCP and complains of delayed ejaculation -On tamsulosin for BPH and sildenafil for ED -PSA 03/2020 1.89 (0.98 June 2020)   PMH: Past Medical History:  Diagnosis Date  . Allergic rhinitis, cause unspecified   . Arthritis of neck   . Arthritis of shoulder    "right shoulder blade" per patient  . Chest pain, unspecified   . Chicken pox   . Chronic prostatitis   . Clostridium difficile colitis    following course of amoxicillin 10/2012 then Cefdinir 10/2013; recurrent C. difficile after Flagyl and intolerant to Vancoycin; s/p Dificid followed by fecal transplant 10/28/13  . Elevated blood pressure reading without diagnosis of hypertension   . Esophageal reflux   . Hypertension   . Insomnia, unspecified   . Irritability   . Nonspecific abnormal electrocardiogram (ECG) (EKG)   . Other alopecia   . Other and unspecified hyperlipidemia   . Other testicular hypofunction   . Sleep apnea    03/07/2020: "about 3-4 years ago, wears CPAP  every night"  . Sleep related leg cramps   . Unspecified contusion of eye   . Unspecified hearing loss   . Unspecified hyperplasia of prostate without urinary obstruction and other lower urinary tract symptoms (LUTS)     Surgical History: Past Surgical History:  Procedure Laterality Date  . ANTERIOR CERVICAL DECOMPRESSION/DISCECTOMY FUSION 4 LEVELS N/A 03/09/2020   Procedure: ANTERIOR CERVICAL DECOMPRESSION/DISCECTOMY FUSION, INTERBODY PROSTHESIS, PLATE/SCREWS,CERVICAL THREE-FOUR,CERVICAL FOUR-FIVE, CERVICAL FIVE-SIX,CERVICAL SIX-SEVEN;  Surgeon: Newman Pies, MD;  Location: [facility];  Service: Neurosurgery;  Laterality: N/A;  anterior  . DENTAL RESTORATION/EXTRACTION WITH X-RAY    . TONSILLECTOMY  01/31/2009   [name]    Home Medications:  Allergies as of 04/19/2020      Reactions   Ibuprofen Itching, Swelling, Other (See Comments)   Facial swelling   Other Other (See Comments)   NO "BROAD SPECTRUM ABX - ONLY "TARGETED" ONES, DUE TO C-DIFF THAT "ALMOST KILLED HIM" (had a fecal transplant)   Bee Venom Swelling, Other (See Comments)   Extreme localized swelling from bee stings   Codeine    Insomnia and Hyperactivity   Nsaids Swelling, Other (See Comments)   Facial swelling   Amlodipine Other (See Comments)   Dizziness      Medication List       Accurate as of April 19, 2020  8:23 AM. If you have any questions, ask your nurse or doctor.        STOP taking these medications   docusate sodium 100 MG capsule Commonly known as: COLACE Stopped by: [name]  [name], MD   Oxycodone HCl 10 MG Tabs Stopped by: [name], MD   testosterone cypionate 200 MG/ML injection Commonly known as: DEPOTESTOSTERONE CYPIONATE Stopped by: [name], MD     TAKE these medications   acetaminophen 500 MG tablet Commonly known as: TYLENOL Take 1,000 mg by mouth every 6 (six) hours as needed for mild pain or headache.   AMBULATORY NON FORMULARY MEDICATION Patient has OSA or  probable OSA (Yes or No): yes Is the patient currently using CPAP within the home? (Yes or No): yes If yes to question 2, what is the current DME provider?  Are there any changes to the order/settings? (if yes, please specify) no If no to question 2, date of sleep study: 10/22/17 Date of face-to-face encounter: 01/13/2018 Settings: auto 5-20 cm h2O Signs and symptoms or probable OSA, mention all that apply (snoring) (morning  Headaches) (witnessed apneas) (choking) (gasping during sleep): snoring, apnea Resmed S/O air/auto with heated humidity.   Enroll in [place] [employer] CPAP supplies needed: mask of choice, headgear, cushions, filters, climate control tubing and water chamber.   apixaban 5 MG Tabs tablet Commonly known as: ELIQUIS Take 1 tablet (5 mg total) by mouth 2 (two) times daily.   augmented betamethasone dipropionate 0.05 % cream Commonly known as: DIPROLENE-AF   buPROPion 150 MG 24 hr tablet Commonly known as: WELLBUTRIN XL TAKE ONE TABLET BY MOUTH EVERY DAY What changed: when to take this   CoQ-10 100 MG Caps Take 100 mg by mouth at bedtime.   cyclobenzaprine 10 MG tablet Commonly known as: FLEXERIL Take 1 tablet (10 mg total) by mouth 3 (three) times daily as needed for muscle spasms. What changed: when to take this   fluticasone 50 MCG/ACT nasal spray Commonly known as: FLONASE Place 1 spray into both nostrils 2 (two) times daily as needed for allergies or rhinitis.   gabapentin 100 MG capsule Commonly known as: NEURONTIN Take 2 capsules (200 mg total) by mouth at bedtime. What changed:   how much to take  when to take this  additional instructions   GenTeal 0.25-0.3 % Gel Generic drug: Carboxymethylcell-Hypromellose Place 1 application into both eyes 2 (two) times daily as needed (for dryness).   LORazepam 0.5 MG tablet Commonly known as: ATIVAN Take by mouth.   omeprazole 40 MG capsule Commonly known as: PRILOSEC Take 40 mg by  mouth daily before supper.   pravastatin 20 MG tablet Commonly known as: PRAVACHOL TAKE 1 TABLET EVERY OTHER DAY AT BEDTIME   PRESCRIPTION MEDICATION CPAP- At bedtime   PROBIOTIC PO Take 1 capsule by mouth daily.   sildenafil 20 MG tablet Commonly known as: REVATIO Take by mouth.   sodium chloride 0.65 % Soln nasal spray Commonly known as: OCEAN Place 1 spray into both nostrils as needed for congestion.   tamsulosin 0.4 MG Caps capsule Commonly known as: FLOMAX Take 0.4 mg by mouth daily.   Tart Cherry Advanced Caps Take 2 capsules by mouth every evening.   valsartan 320 MG tablet Commonly known as: DIOVAN Take 320 mg by mouth at bedtime.       Allergies:  Allergies  Allergen Reactions  . Ibuprofen Itching, Swelling and Other (See Comments)    Facial swelling   . Other Other (See Comments)    NO "BROAD SPECTRUM ABX - ONLY "TARGETED" ONES, DUE TO C-DIFF THAT "ALMOST KILLED HIM" (had a fecal transplant)  . Bee Venom Swelling and Other (See Comments)  Extreme localized swelling from bee stings   . Codeine     Insomnia and Hyperactivity  . Nsaids Swelling and Other (See Comments)    Facial swelling  . Amlodipine Other (See Comments)    Dizziness     Family History: Family History  Problem Relation Age of Onset  . Hypertension Mother   . Lung cancer Mother   . Depression Mother   . Hyperlipidemia Mother   . Arthritis Mother   . Kidney cancer Mother   . Cancer Mother        kidney, lung  . CAD Father        CABG age 101  . Hypertension Father   . Stroke Father   . Hyperlipidemia Father   . Prostate cancer Father   . Congestive Heart Failure Father   . Cancer Father        prostate  . Cancer Other        malignancy prostate  . Migraines Sister   . Liver disease Brother        on liver transplant list  . Ulcerative colitis Brother   . COPD Neg Hx   . Diabetes Neg Hx   . Heart disease Neg Hx     Social History:  reports that he has never  smoked. He has never used smokeless tobacco. He reports current alcohol use. He reports that he does not use drugs.   Physical Exam: BP 130/83   Pulse 71   Ht 6' (1.829 m)   Wt 238 lb (108 kg)   BMI 32.28 kg/m   Constitutional:  Alert and oriented, No acute distress. HEENT: [place] AT, moist mucus membranes.  Trachea midline, no masses. Cardiovascular: No clubbing, cyanosis, or edema. Respiratory: Normal respiratory effort, no increased work of breathing. Skin: No rashes, bruises or suspicious lesions. Neurologic: Grossly intact, no focal deficits, moving all 4 extremities. Psychiatric: Normal mood and affect.   Assessment & Plan:    1.  Hypogonadism -Testosterone has been held due to recent DVT.  Will check testosterone and LH.  If LH not elevated could consider off label use of clomiphene citrate to stimulate native testosterone production however will await his hematology evaluation to make sure this would not be contraindicated.  2.  Prostate cancer screening -Recent PSA slightly elevated above baseline and would recommend rechecking August 2021  3.  Ejaculatory dysfunction -Secondary to sertraline.  States PCP recommended staying on for 3 months.  Discussed a 48-hour "drug holiday" when needed.  [place] [address] [place], [place] [address] [phone]

## 2020-04-19 NOTE — Telephone Encounter (Signed)
error 

## 2020-04-20 ENCOUNTER — Encounter: Payer: Self-pay | Admitting: Urology

## 2020-04-20 LAB — LUTEINIZING HORMONE: LH: 5.6 m[IU]/mL (ref 1.7–8.6)

## 2020-04-20 LAB — TESTOSTERONE: Testosterone: 114 ng/dL — ABNORMAL LOW (ref 264–916)

## 2020-04-24 ENCOUNTER — Telehealth: Payer: Self-pay | Admitting: *Deleted

## 2020-04-24 NOTE — Telephone Encounter (Signed)
Notified patient as instructed, patient pleased. Discussed follow-up appointments, patient agrees  

## 2020-04-24 NOTE — Telephone Encounter (Signed)
-----   Message from [name], MD sent at 04/24/2020 11:00 AM EDT ----- Testosterone level was 114.  LH was normal at 5.6.  He would be a candidate for Clomid.  Will await recommendations at his hematology appointment.

## 2020-08-02 ENCOUNTER — Other Ambulatory Visit: Payer: 59

## 2020-08-02 ENCOUNTER — Other Ambulatory Visit: Payer: Self-pay

## 2020-08-02 DIAGNOSIS — Z20822 Contact with and (suspected) exposure to covid-19: Secondary | ICD-10-CM

## 2020-08-03 LAB — SARS-COV-2, NAA 2 DAY TAT

## 2020-08-03 LAB — NOVEL CORONAVIRUS, NAA: SARS-CoV-2, NAA: NOT DETECTED

## 2021-06-09 IMAGING — CR DG CERVICAL SPINE 2 OR 3 VIEWS
1 series · 1 of 1 positions shown · non-contrast
Comparison: 01/17/2020.

CLINICAL DATA: Operative imaging for anterior cervical
decompression/discectomy and fusion.

EXAM:
CERVICAL SPINE - 2-3 VIEW

[xtable]
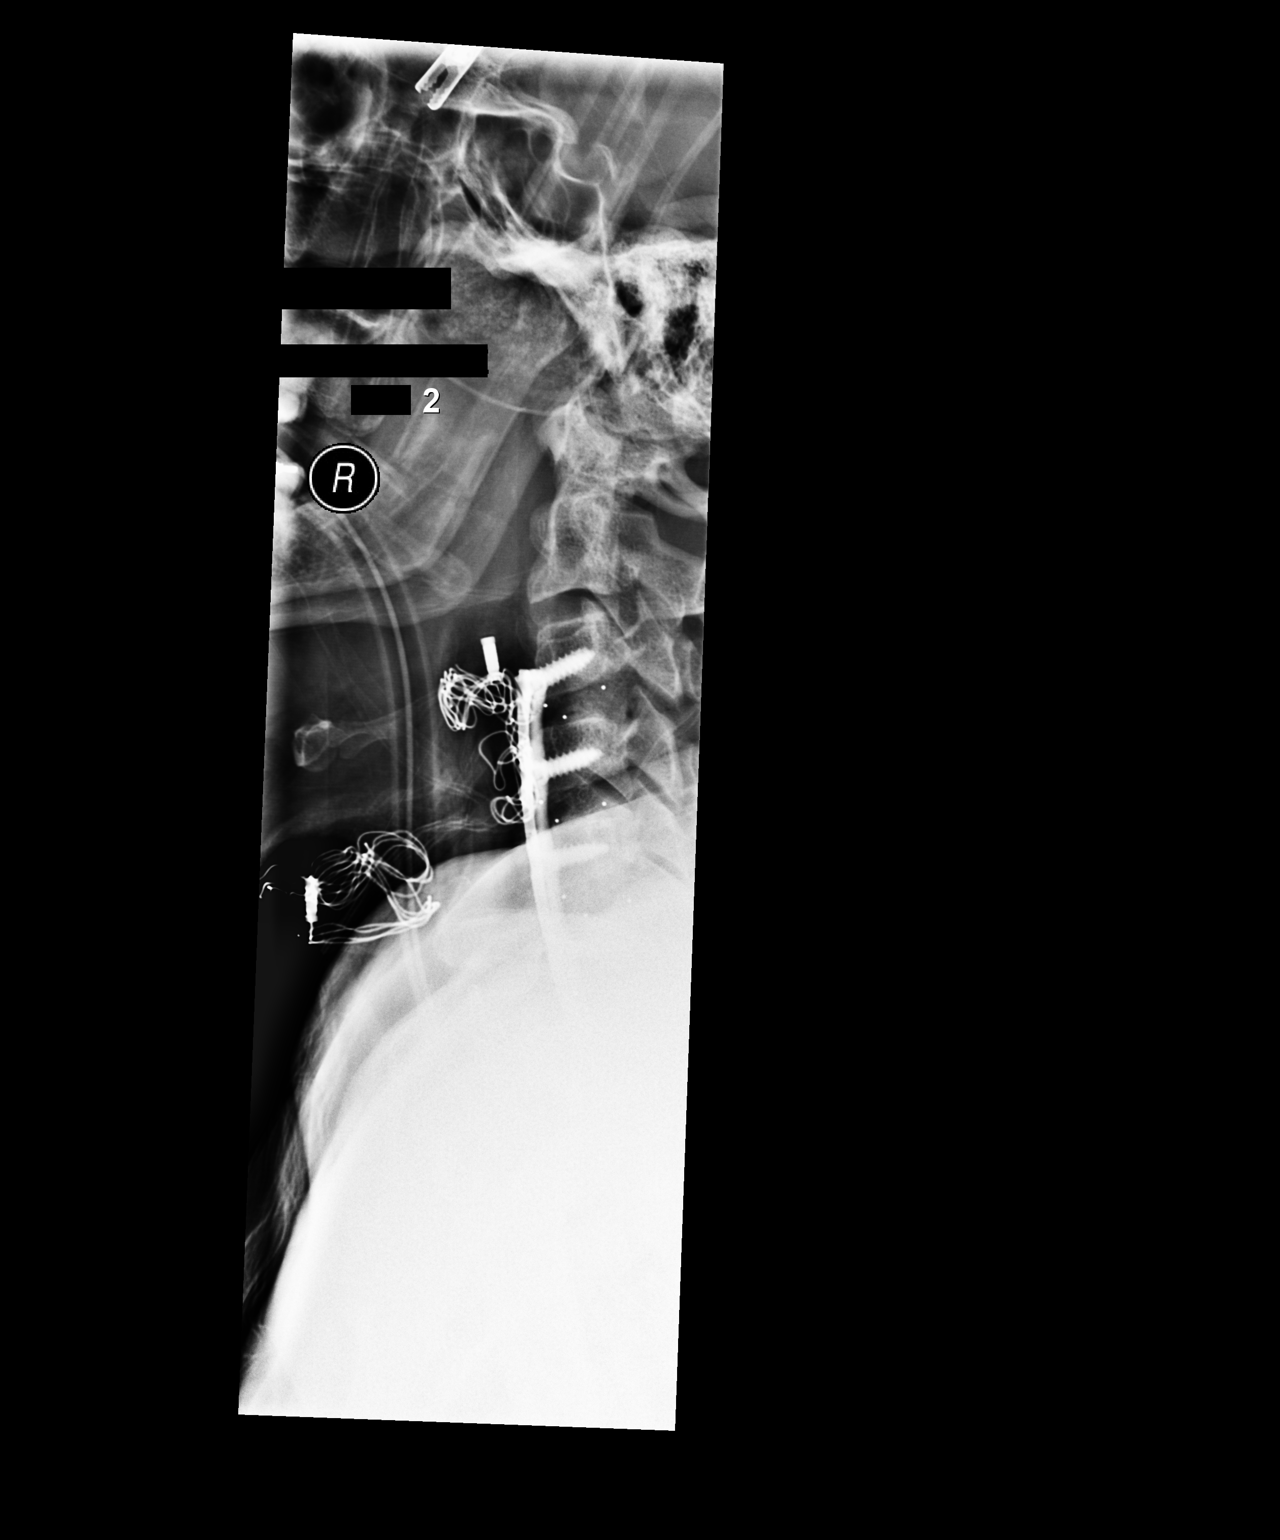

[1 of 1 positions shown; findings below may reference images not displayed]

FINDINGS: Two submitted images. Additional image shows placement of a surgical
needle with its tip projecting over the C3-C4 disc. Follow-up image
shows placement a fixation plate and associated screws spanning C3
through C7. The orthopedic hardware appears well seated.
IMPRESSION: Well-positioned orthopedic hardware following anterior cervical
fusion, C3 through C7.

## 2021-09-11 ENCOUNTER — Other Ambulatory Visit: Payer: Self-pay

## 2021-09-11 ENCOUNTER — Ambulatory Visit (INDEPENDENT_AMBULATORY_CARE_PROVIDER_SITE_OTHER): Payer: 59 | Admitting: Cardiovascular Disease

## 2021-09-11 VITALS — BP 134/68 | HR 63 | Ht 72.0 in | Wt 242.0 lb

## 2021-09-11 DIAGNOSIS — R079 Chest pain, unspecified: Secondary | ICD-10-CM | POA: Diagnosis not present

## 2021-09-11 DIAGNOSIS — I10 Essential (primary) hypertension: Secondary | ICD-10-CM

## 2021-09-11 DIAGNOSIS — R0609 Other forms of dyspnea: Secondary | ICD-10-CM | POA: Diagnosis not present

## 2021-09-11 DIAGNOSIS — E785 Hyperlipidemia, unspecified: Secondary | ICD-10-CM

## 2021-09-11 DIAGNOSIS — Z01818 Encounter for other preprocedural examination: Secondary | ICD-10-CM

## 2021-09-11 DIAGNOSIS — Z01812 Encounter for preprocedural laboratory examination: Secondary | ICD-10-CM

## 2021-09-11 LAB — BASIC METABOLIC PANEL
BUN/Creatinine Ratio: 14 (ref 9–20)
BUN: 17 mg/dL (ref 6–24)
CO2: 25 mmol/L (ref 20–29)
Calcium: 10.2 mg/dL (ref 8.7–10.2)
Chloride: 101 mmol/L (ref 96–106)
Creatinine, Ser: 1.25 mg/dL (ref 0.76–1.27)
Glucose: 104 mg/dL — ABNORMAL HIGH (ref 70–99)
Potassium: 4.9 mmol/L (ref 3.5–5.2)
Sodium: 140 mmol/L (ref 134–144)
eGFR: 68 mL/min/{1.73_m2} (ref >59)

## 2021-09-11 LAB — CBC
Hematocrit: 43.5 % (ref 37.5–51.0)
Hemoglobin: 14 g/dL (ref 13.0–17.7)
MCH: 25.7 pg — ABNORMAL LOW (ref 26.6–33.0)
MCHC: 32.2 g/dL (ref 31.5–35.7)
MCV: 80 fL (ref 79–97)
Platelets: 241 10*3/uL (ref 150–450)
RBC: 5.44 x10E6/uL (ref 4.14–5.80)
RDW: 15.6 % — ABNORMAL HIGH (ref 11.6–15.4)
WBC: 5.3 10*3/uL (ref 3.4–10.8)

## 2021-09-11 NOTE — Patient Instructions (Signed)
Medication Instructions:  No changes *If you need a refill on your cardiac medications before your next appointment, please call your pharmacy*  Testing/Procedures: Your physician has requested that you have a cardiac catheterization. Cardiac catheterization is used to diagnose and/or treat various heart conditions. Doctors may recommend this procedure for a number of different reasons. The most common reason is to evaluate chest pain. Chest pain can be a symptom of coronary artery disease (CAD), and cardiac catheterization can show whether plaque is narrowing or blocking your heart's arteries. This procedure is also used to evaluate the valves, as well as measure the blood flow and oxygen levels in different parts of your heart. For further information please visit [email or web address]. Please follow instruction sheet, as given.  Your physician has requested that you have an echocardiogram before the cardiac cath on 10/01/21. Echocardiography is a painless test that uses sound waves to create images of your heart. It provides your doctor with information about the size and shape of your heart and how well your heart's chambers and valves are working. You may receive an ultrasound enhancing agent through an IV if needed to better visualize your heart during the echo.This procedure takes approximately one hour. There are no restrictions for this procedure.    Follow-Up: At [facility] you and your health needs are our priority.  As part of our continuing mission to provide you with exceptional heart care, we have created designated Provider Care Teams.  These Care Teams include your primary Cardiologist (physician) and Advanced Practice Providers (APPs -  Physician Assistants and Nurse Practitioners) who all work together to provide you with the care you need, when you need it.  We recommend signing up for the patient portal called "MyChart".  Sign up information is provided on this After Visit  Summary.  MyChart is used to connect with patients for Virtual Visits (Telemedicine).  Patients are able to view lab/test results, encounter notes, upcoming appointments, etc.  Non-urgent messages can be sent to your provider as well.   To learn more about what you can do with MyChart, go to [email or web address].    Your next appointment:   Follow up with Dr. Fletcher Anon on 10/26/21 at 3:40 in [facility]  Other [place], [place] [address] Dept: [phone] Loc: [phone]   You are scheduled for a Cardiac Catheterization on Monday, November 14 with Dr. Kathlyn [place].  1. Arrive at the [facility] entrance at 8:30 am, one hour prior to your procedure. Free valet service is available.  After entering the [facility] please check-in at the registration desk (1st desk on your right) to receive your armband. After receiving your armband someone will escort you to the cardiac cath/special procedures waiting area.  If you have any questions, please call our office at [phone], or you may call the cardiac cath lab at [facility] directly at [phone]   Special note: Every effort is made to have your procedure done on time. Please understand that emergencies sometimes delay scheduled procedures.   2. Diet: Do not eat solid foods after midnight.  The patient may have clear liquids until 5am upon the day of the procedure.  3. Labs: You will need to have blood drawn on 10/25  4. Medication instructions in preparation for your procedure: Nothing to hold  On the morning of your procedure, take your Aspirin and any morning medicines NOT listed above.  You may  use sips of water.  5. Plan for one night stay--bring personal belongings. 6. Bring a current list of your medications and current insurance cards. 7. You MUST have a responsible person to drive you home. 8.  Someone MUST be with you the first 24 hours after you arrive home or your discharge will be delayed. 9. Please wear clothes that are easy to get on and off and wear slip-on shoes.  Thank you for allowing [place] to care for you!   -- [facility]

## 2021-09-11 NOTE — Progress Notes (Signed)
Cardiology Office Note   Date:  09/11/2021   ID:  Jackson, Coffield 11-30-1965, MRN [id number]  PCP:  [name], MD  Cardiologist:   Kathlyn Sacramento, MD   Chief Complaint  Patient presents with   New Patient (Initial Visit)        Headache   Chest Pain   Shortness of Breath      History of Present Illness: Tom Wilkerson is a 55 y.o. male who was referred by Dr. Tamala Julian for evaluation and management of exertional dyspnea and chest pain.  He has no prior cardiac history.  He had previous spinal fusion in May 2021 that was then complicated by right leg DVT.  He was treated with anticoagulation for 6 months.  Other medical issues include essential hypertension, hyperlipidemia, sleep apnea on CPAP and obesity.  He is not a smoker.  He has family history of premature coronary artery disease.  One of his brothers had myocardial infarction and CABG in his 70s even though he was a marathon runner.  His father had CABG in his early 8s.  The patient has known history of testosterone deficiency and takes testosterone injection which caused increased hemoglobin. He has been under significant stress at work.  He has been having dyspnea with minimal exertion over the last 6 months with dizziness and excessive sweating.  In addition, he has occasional substernal chest tightness that happens with exertion.  Due to the symptoms, he cut down his activity significantly.    Past Medical History:  Diagnosis Date   Allergic rhinitis, cause unspecified    Arthritis of neck    Arthritis of shoulder    "right shoulder blade" per patient   Chest pain, unspecified    Chicken pox    Chronic prostatitis    Clostridium difficile colitis    following course of amoxicillin 10/2012 then Cefdinir 10/2013; recurrent C. difficile after Flagyl and intolerant to Vancoycin; s/p Dificid followed by fecal transplant 10/28/13   Elevated blood pressure reading without diagnosis of hypertension    Esophageal  reflux    Hypertension    Insomnia, unspecified    Irritability    Nonspecific abnormal electrocardiogram (ECG) (EKG)    Other alopecia    Other and unspecified hyperlipidemia    Other testicular hypofunction    Sleep apnea    03/07/2020: "about 3-4 years ago, wears CPAP every night"   Sleep related leg cramps    Unspecified contusion of eye    Unspecified hearing loss    Unspecified hyperplasia of prostate without urinary obstruction and other lower urinary tract symptoms (LUTS)     Past Surgical History:  Procedure Laterality Date   ANTERIOR CERVICAL DECOMPRESSION/DISCECTOMY FUSION 4 LEVELS N/A 03/09/2020   Procedure: ANTERIOR CERVICAL DECOMPRESSION/DISCECTOMY FUSION, INTERBODY PROSTHESIS, PLATE/SCREWS,CERVICAL THREE-FOUR,CERVICAL FOUR-FIVE, CERVICAL FIVE-SIX,CERVICAL SIX-SEVEN;  Surgeon: Newman Pies, MD;  Location: [place];  Service: Neurosurgery;  Laterality: N/A;  anterior   DENTAL RESTORATION/EXTRACTION WITH X-RAY     TONSILLECTOMY  01/31/2009   Richardson Landry     Current Outpatient Medications  Medication Sig Dispense Refill   acetaminophen (TYLENOL) 500 MG tablet Take 1,000 mg by mouth every 6 (six) hours as needed for mild pain or headache.      AMBULATORY NON FORMULARY MEDICATION Patient has OSA or probable OSA (Yes or No): yes Is the patient currently using CPAP within the home? (Yes or No): yes If yes to question 2, what is the current DME provider?  Are there  any changes to the order/settings? (if yes, please specify) no If no to question 2, date of sleep study: 10/22/17 Date of face-to-face encounter: 01/13/2018 Settings: auto 5-20 cm h2O Signs and symptoms or probable OSA, mention all that apply (snoring) (morning  Headaches) (witnessed apneas) (choking) (gasping during sleep): snoring, apnea Resmed S/O air/auto with heated humidity.   Enroll in [place] [employer] CPAP supplies needed: mask of choice, headgear, cushions, filters, climate control tubing and  water chamber. 1 each 0   augmented betamethasone dipropionate (DIPROLENE-AF) 0.05 % cream      buPROPion (WELLBUTRIN XL) 150 MG 24 hr tablet TAKE ONE TABLET BY MOUTH EVERY DAY (Patient taking differently: Take 150 mg by mouth in the morning.) 90 tablet 3   Carboxymethylcell-Hypromellose (GENTEAL) 0.25-0.3 % GEL Place 1 application into both eyes 2 (two) times daily as needed (for dryness).     Coenzyme Q10 (COQ-10) 100 MG CAPS Take 100 mg by mouth at bedtime.     cyclobenzaprine (FLEXERIL) 10 MG tablet Take 1 tablet (10 mg total) by mouth 3 (three) times daily as needed for muscle spasms. (Patient taking differently: Take 10 mg by mouth in the morning, at noon, and at bedtime.) 30 tablet 0   fluticasone (FLONASE) 50 MCG/ACT nasal spray Place 1 spray into both nostrils 2 (two) times daily as needed for allergies or rhinitis.     gabapentin (NEURONTIN) 100 MG capsule Take 2 capsules (200 mg total) by mouth at bedtime. (Patient taking differently: Take 200-300 mg by mouth See admin instructions. Take 200 mg by mouth in the morning, 200 mg in the afternoon, 200-300 mg at bedtime and an additional 200 mg once a day as needed for neuropathic pain) 180 capsule 3   LORazepam (ATIVAN) 0.5 MG tablet Take by mouth.     Misc Natural Products (TART CHERRY ADVANCED) CAPS Take 2 capsules by mouth every evening.     omeprazole (PRILOSEC) 40 MG capsule Take 40 mg by mouth daily before supper.      pravastatin (PRAVACHOL) 20 MG tablet TAKE 1 TABLET EVERY OTHER DAY AT BEDTIME (Patient taking differently: Take 20 mg by mouth at bedtime.) 45 tablet 0   PRESCRIPTION MEDICATION CPAP- At bedtime     Probiotic Product (PROBIOTIC PO) Take 1 capsule by mouth daily.      sildenafil (REVATIO) 20 MG tablet Take by mouth.     sodium chloride (OCEAN) 0.65 % SOLN nasal spray Place 1 spray into both nostrils as needed for congestion.     tamsulosin (FLOMAX) 0.4 MG CAPS capsule Take 0.4 mg by mouth daily.     valsartan (DIOVAN) 320  MG tablet Take 320 mg by mouth at bedtime.     apixaban (ELIQUIS) 5 MG TABS tablet Take 1 tablet (5 mg total) by mouth 2 (two) times daily. 60 tablet 0   No current facility-administered medications for this visit.    Allergies:   Ibuprofen, Other, Bee venom, Codeine, Nsaids, and Amlodipine    Social History:  The patient  reports that he has never smoked. He has never used smokeless tobacco. He reports current alcohol use. He reports that he does not use drugs.   Family History:  The patient's family history includes Arthritis in his mother; CAD in his father; Cancer in his father, mother, and another family member; Congestive Heart Failure in his father; Depression in his mother; Hyperlipidemia in his father and mother; Hypertension in his father and mother; Kidney cancer in his mother; Liver  disease in his brother; Lung cancer in his mother; Migraines in his sister; Prostate cancer in his father; Stroke in his father; Ulcerative colitis in his brother.    ROS:  Please see the history of present illness.   Otherwise, review of systems are positive for none.   All other systems are reviewed and negative.    PHYSICAL EXAM: VS:  BP 134/68 (BP Location: Right Arm, Patient Position: Sitting, Cuff Size: Large)   Pulse 63   Ht 6' (1.829 m)   Wt 242 lb (109.8 kg)   BMI 32.82 kg/m  , BMI Body mass index is 32.82 kg/m. GEN: Well nourished, well developed, in no acute distress  HEENT: normal  Neck: no JVD, carotid bruits, or masses Cardiac: RRR; no murmurs, rubs, or gallops,no edema  Respiratory:  clear to auscultation bilaterally, normal work of breathing GI: soft, nontender, nondistended, + BS MS: no deformity or atrophy  Skin: warm and dry, no rash Neuro:  Strength and sensation are intact Psych: euthymic mood, full affect   EKG:  EKG is ordered today. The ekg ordered today demonstrates normal sinus rhythm with possible left atrial enlargement and no significant ST or T wave  changes.   Recent Labs: No results found for requested labs within last 8760 hours.    Lipid Panel    Component Value Date/Time   CHOL 141 03/27/2018 1514   TRIG 95 03/27/2018 1514   TRIG 113 06/12/2015 0839   HDL 37 (L) 03/27/2018 1514   HDL 36 (L) 06/12/2015 0839   CHOLHDL 3.8 03/27/2018 1514   VLDL 21 01/31/2017 0853   LDLCALC 85 03/27/2018 1514      Wt Readings from Last 3 Encounters:  09/11/21 242 lb (109.8 kg)  04/19/20 238 lb (108 kg)  03/15/20 235 lb 14.3 oz (107 kg)       No flowsheet data found.    ASSESSMENT AND PLAN:  1.  Stable angina and exertional dyspnea: The patient has symptoms suggestive of class III angina with significant shortness of breath.  He has multiple risk factors for coronary artery disease.  I discussed different management options including stress testing and cardiac CTA.  He does have mild chronic kidney disease with a creatinine of 1.5 and we have to minimize contrast.  Given severity of his symptoms, I think the best option is to proceed with a right and left cardiac catheterization possible PCI.  I discussed the procedure in details as well as risks and benefits.  We will plan on obtaining an echocardiogram before cardiac catheterization.  2.  Sleep apnea: Uses CPAP on a regular basis.  3.  Essential hypertension: Blood pressure is controlled.  4.  Hyperlipidemia: Currently on pravastatin with most recent LDL of 115.  If we confirmed significant coronary artery disease, we will switch to a more potent statin.    Disposition:   Proceed with a right and left cardiac catheterization possible PCI.  Follow-up with me after.  Signed,  Kathlyn Sacramento, MD  09/11/2021 9:56 AM    [facility]

## 2021-09-11 NOTE — H&P (View-Only) (Signed)
Cardiology Office Note   Date:  09/11/2021   ID:  [name], [name] [birth year], MRN [id number]  PCP:  [name], MD  Cardiologist:   Kathlyn Sacramento, MD   Chief Complaint  Patient presents with   New Patient (Initial Visit)        Headache   Chest Pain   Shortness of Breath      History of Present Illness: Tom Wilkerson is a 55 y.o. male who was referred by Dr. Tamala Julian for evaluation and management of exertional dyspnea and chest pain.  He has no prior cardiac history.  He had previous spinal fusion in May 2021 that was then complicated by right leg DVT.  He was treated with anticoagulation for 6 months.  Other medical issues include essential hypertension, hyperlipidemia, sleep apnea on CPAP and obesity.  He is not a smoker.  He has family history of premature coronary artery disease.  One of his brothers had myocardial infarction and CABG in his 81s even though he was a marathon runner.  His father had CABG in his early 49s.  The patient has known history of testosterone deficiency and takes testosterone injection which caused increased hemoglobin. He has been under significant stress at work.  He has been having dyspnea with minimal exertion over the last 6 months with dizziness and excessive sweating.  In addition, he has occasional substernal chest tightness that happens with exertion.  Due to the symptoms, he cut down his activity significantly.    Past Medical History:  Diagnosis Date   Allergic rhinitis, cause unspecified    Arthritis of neck    Arthritis of shoulder    "right shoulder blade" per patient   Chest pain, unspecified    Chicken pox    Chronic prostatitis    Clostridium difficile colitis    following course of amoxicillin 10/2012 then Cefdinir 10/2013; recurrent C. difficile after Flagyl and intolerant to Vancoycin; s/p Dificid followed by fecal transplant 10/28/13   Elevated blood pressure reading without diagnosis of hypertension    Esophageal  reflux    Hypertension    Insomnia, unspecified    Irritability    Nonspecific abnormal electrocardiogram (ECG) (EKG)    Other alopecia    Other and unspecified hyperlipidemia    Other testicular hypofunction    Sleep apnea    03/07/2020: "about 3-4 years ago, wears CPAP every night"   Sleep related leg cramps    Unspecified contusion of eye    Unspecified hearing loss    Unspecified hyperplasia of prostate without urinary obstruction and other lower urinary tract symptoms (LUTS)     Past Surgical History:  Procedure Laterality Date   ANTERIOR CERVICAL DECOMPRESSION/DISCECTOMY FUSION 4 LEVELS N/A 03/09/2020   Procedure: ANTERIOR CERVICAL DECOMPRESSION/DISCECTOMY FUSION, INTERBODY PROSTHESIS, PLATE/SCREWS,CERVICAL THREE-FOUR,CERVICAL FOUR-FIVE, CERVICAL FIVE-SIX,CERVICAL SIX-SEVEN;  Surgeon: Newman Pies, MD;  Location: [place];  Service: Neurosurgery;  Laterality: N/A;  anterior   DENTAL RESTORATION/EXTRACTION WITH X-RAY     TONSILLECTOMY  01/31/2009   [facility] Landry     Current Outpatient Medications  Medication Sig Dispense Refill   acetaminophen (TYLENOL) 500 MG tablet Take 1,000 mg by mouth every 6 (six) hours as needed for mild pain or headache.      AMBULATORY NON FORMULARY MEDICATION Patient has OSA or probable OSA (Yes or No): yes Is the patient currently using CPAP within the home? (Yes or No): yes If yes to question 2, what is the current DME provider?  Are there  any changes to the order/settings? (if yes, please specify) no If no to question 2, date of sleep study: 10/22/17 Date of face-to-face encounter: 01/13/2018 Settings: auto 5-20 cm h2O Signs and symptoms or probable OSA, mention all that apply (snoring) (morning  Headaches) (witnessed apneas) (choking) (gasping during sleep): snoring, apnea Resmed S/O air/auto with heated humidity.   Enroll in [place] [employer] CPAP supplies needed: mask of choice, headgear, cushions, filters, climate control tubing and  water chamber. 1 each 0   augmented betamethasone dipropionate (DIPROLENE-AF) 0.05 % cream      buPROPion (WELLBUTRIN XL) 150 MG 24 hr tablet TAKE ONE TABLET BY MOUTH EVERY DAY (Patient taking differently: Take 150 mg by mouth in the morning.) 90 tablet 3   Carboxymethylcell-Hypromellose (GENTEAL) 0.25-0.3 % GEL Place 1 application into both eyes 2 (two) times daily as needed (for dryness).     Coenzyme Q10 (COQ-10) 100 MG CAPS Take 100 mg by mouth at bedtime.     cyclobenzaprine (FLEXERIL) 10 MG tablet Take 1 tablet (10 mg total) by mouth 3 (three) times daily as needed for muscle spasms. (Patient taking differently: Take 10 mg by mouth in the morning, at noon, and at bedtime.) 30 tablet 0   fluticasone (FLONASE) 50 MCG/ACT nasal spray Place 1 spray into both nostrils 2 (two) times daily as needed for allergies or rhinitis.     gabapentin (NEURONTIN) 100 MG capsule Take 2 capsules (200 mg total) by mouth at bedtime. (Patient taking differently: Take 200-300 mg by mouth See admin instructions. Take 200 mg by mouth in the morning, 200 mg in the afternoon, 200-300 mg at bedtime and an additional 200 mg once a day as needed for neuropathic pain) 180 capsule 3   LORazepam (ATIVAN) 0.5 MG tablet Take by mouth.     Misc Natural Products (TART CHERRY ADVANCED) CAPS Take 2 capsules by mouth every evening.     omeprazole (PRILOSEC) 40 MG capsule Take 40 mg by mouth daily before supper.      pravastatin (PRAVACHOL) 20 MG tablet TAKE 1 TABLET EVERY OTHER DAY AT BEDTIME (Patient taking differently: Take 20 mg by mouth at bedtime.) 45 tablet 0   PRESCRIPTION MEDICATION CPAP- At bedtime     Probiotic Product (PROBIOTIC PO) Take 1 capsule by mouth daily.      sildenafil (REVATIO) 20 MG tablet Take by mouth.     sodium chloride (OCEAN) 0.65 % SOLN nasal spray Place 1 spray into both nostrils as needed for congestion.     tamsulosin (FLOMAX) 0.4 MG CAPS capsule Take 0.4 mg by mouth daily.     valsartan (DIOVAN) 320  MG tablet Take 320 mg by mouth at bedtime.     apixaban (ELIQUIS) 5 MG TABS tablet Take 1 tablet (5 mg total) by mouth 2 (two) times daily. 60 tablet 0   No current facility-administered medications for this visit.    Allergies:   Ibuprofen, Other, Bee venom, Codeine, Nsaids, and Amlodipine    Social History:  The patient  reports that he has never smoked. He has never used smokeless tobacco. He reports current alcohol use. He reports that he does not use drugs.   Family History:  The patient's family history includes Arthritis in his mother; CAD in his father; Cancer in his father, mother, and another family member; Congestive Heart Failure in his father; Depression in his mother; Hyperlipidemia in his father and mother; Hypertension in his father and mother; Kidney cancer in his mother; Liver  disease in his brother; Lung cancer in his mother; Migraines in his sister; Prostate cancer in his father; Stroke in his father; Ulcerative colitis in his brother.    ROS:  Please see the history of present illness.   Otherwise, review of systems are positive for none.   All other systems are reviewed and negative.    PHYSICAL EXAM: VS:  BP 134/68 (BP Location: Right Arm, Patient Position: Sitting, Cuff Size: Large)   Pulse 63   Ht 6' (1.829 m)   Wt 242 lb (109.8 kg)   BMI 32.82 kg/m  , BMI Body mass index is 32.82 kg/m. GEN: Well nourished, well developed, in no acute distress  HEENT: normal  Neck: no JVD, carotid bruits, or masses Cardiac: RRR; no murmurs, rubs, or gallops,no edema  Respiratory:  clear to auscultation bilaterally, normal work of breathing GI: soft, nontender, nondistended, + BS MS: no deformity or atrophy  Skin: warm and dry, no rash Neuro:  Strength and sensation are intact Psych: euthymic mood, full affect   EKG:  EKG is ordered today. The ekg ordered today demonstrates normal sinus rhythm with possible left atrial enlargement and no significant ST or T wave  changes.   Recent Labs: No results found for requested labs within last 8760 hours.    Lipid Panel    Component Value Date/Time   CHOL 141 03/27/2018 1514   TRIG 95 03/27/2018 1514   TRIG 113 06/12/2015 0839   HDL 37 (L) 03/27/2018 1514   HDL 36 (L) 06/12/2015 0839   CHOLHDL 3.8 03/27/2018 1514   VLDL 21 01/31/2017 0853   LDLCALC 85 03/27/2018 1514      Wt Readings from Last 3 Encounters:  09/11/21 242 lb (109.8 kg)  04/19/20 238 lb (108 kg)  03/15/20 235 lb 14.3 oz (107 kg)       No flowsheet data found.    ASSESSMENT AND PLAN:  1.  Stable angina and exertional dyspnea: The patient has symptoms suggestive of class III angina with significant shortness of breath.  He has multiple risk factors for coronary artery disease.  I discussed different management options including stress testing and cardiac CTA.  He does have mild chronic kidney disease with a creatinine of 1.5 and we have to minimize contrast.  Given severity of his symptoms, I think the best option is to proceed with a right and left cardiac catheterization possible PCI.  I discussed the procedure in details as well as risks and benefits.  We will plan on obtaining an echocardiogram before cardiac catheterization.  2.  Sleep apnea: Uses CPAP on a regular basis.  3.  Essential hypertension: Blood pressure is controlled.  4.  Hyperlipidemia: Currently on pravastatin with most recent LDL of 115.  If we confirmed significant coronary artery disease, we will switch to a more potent statin.    Disposition:   Proceed with a right and left cardiac catheterization possible PCI.  Follow-up with me after.  Signed,  Kathlyn Sacramento, MD  09/11/2021 9:56 AM    [facility]

## 2021-09-14 ENCOUNTER — Ambulatory Visit: Payer: 59 | Admitting: Cardiology

## 2021-09-20 ENCOUNTER — Ambulatory Visit (INDEPENDENT_AMBULATORY_CARE_PROVIDER_SITE_OTHER): Payer: 59

## 2021-09-20 ENCOUNTER — Other Ambulatory Visit: Payer: Self-pay

## 2021-09-20 DIAGNOSIS — R079 Chest pain, unspecified: Secondary | ICD-10-CM | POA: Diagnosis not present

## 2021-09-20 LAB — ECHOCARDIOGRAM COMPLETE
AR max vel: 2.5 cm2
AV Area VTI: 2.17 cm2
AV Area mean vel: 1.83 cm2
AV Mean grad: 8 mmHg
AV Peak grad: 14 mmHg
Ao pk vel: 1.87 m/s
Area-P 1/2: 2.99 cm2
Calc EF: 65.4 %
S' Lateral: 3.2 cm
Single Plane A2C EF: 63.4 %
Single Plane A4C EF: 68.3 %

## 2021-09-30 ENCOUNTER — Telehealth: Payer: Self-pay | Admitting: Physician Assistant

## 2021-09-30 NOTE — Telephone Encounter (Signed)
Pt is scheduled for a cardiac catheterization tomorrow at [facility].  He did not receive any instructions and called to find out when he should be there.  I sent a copy of his instructions to him through [place].  I advised him to arrive at 8:30 am as noted on his instructions. Richardson Dopp, PA-C    09/30/2021 1:02 PM

## 2021-10-01 ENCOUNTER — Ambulatory Visit
Admission: RE | Admit: 2021-10-01 | Discharge: 2021-10-01 | Disposition: A | Discharge status: Home / Self Care | Payer: 59 | Attending: Cardiovascular Disease | Admitting: Cardiovascular Disease

## 2021-10-01 ENCOUNTER — Encounter
Admission: RE | Disposition: A | Discharge status: Home / Self Care | Payer: Self-pay | Source: Home / Self Care | Attending: Cardiovascular Disease

## 2021-10-01 ENCOUNTER — Telehealth: Payer: Self-pay | Admitting: Cardiovascular Disease

## 2021-10-01 ENCOUNTER — Other Ambulatory Visit: Payer: Self-pay

## 2021-10-01 ENCOUNTER — Encounter: Payer: Self-pay | Admitting: Cardiovascular Disease

## 2021-10-01 DIAGNOSIS — R0609 Other forms of dyspnea: Secondary | ICD-10-CM

## 2021-10-01 DIAGNOSIS — I2089 Other forms of angina pectoris: Secondary | ICD-10-CM

## 2021-10-01 DIAGNOSIS — I208 Other forms of angina pectoris: Secondary | ICD-10-CM | POA: Diagnosis not present

## 2021-10-01 DIAGNOSIS — Z79899 Other long term (current) drug therapy: Secondary | ICD-10-CM | POA: Insufficient documentation

## 2021-10-01 DIAGNOSIS — N189 Chronic kidney disease, unspecified: Secondary | ICD-10-CM | POA: Insufficient documentation

## 2021-10-01 DIAGNOSIS — E785 Hyperlipidemia, unspecified: Secondary | ICD-10-CM | POA: Insufficient documentation

## 2021-10-01 DIAGNOSIS — G4730 Sleep apnea, unspecified: Secondary | ICD-10-CM | POA: Diagnosis not present

## 2021-10-01 DIAGNOSIS — I129 Hypertensive chronic kidney disease with stage 1 through stage 4 chronic kidney disease, or unspecified chronic kidney disease: Secondary | ICD-10-CM | POA: Insufficient documentation

## 2021-10-01 DIAGNOSIS — R079 Chest pain, unspecified: Secondary | ICD-10-CM

## 2021-10-01 HISTORY — PX: RIGHT/LEFT HEART CATH AND CORONARY ANGIOGRAPHY: CATH118266

## 2021-10-01 SURGERY — RIGHT/LEFT HEART CATH AND CORONARY ANGIOGRAPHY
Anesthesia: Moderate Sedation

## 2021-10-01 MED ORDER — SODIUM CHLORIDE 0.9 % IV SOLN: INTRAVENOUS | Status: DC

## 2021-10-01 MED ORDER — MIDAZOLAM HCL 2 MG/2ML IJ SOLN
INTRAMUSCULAR | Status: DC | PRN
  Administered 2021-10-01 (×2): 1 mg via INTRAVENOUS

## 2021-10-01 MED ORDER — IOHEXOL 350 MG/ML SOLN
INTRAVENOUS | Status: DC | PRN
  Administered 2021-10-01: 31 mL

## 2021-10-01 MED ORDER — HEPARIN (PORCINE) IN NACL 1000-0.9 UT/500ML-% IV SOLN
INTRAVENOUS | Status: AC
  Filled 2021-10-01: qty 1000

## 2021-10-01 MED ORDER — HEPARIN (PORCINE) IN NACL 2000-0.9 UNIT/L-% IV SOLN
INTRAVENOUS | Status: DC | PRN
  Administered 2021-10-01: 1000 mL via INTRACORONARY

## 2021-10-01 MED ORDER — SODIUM CHLORIDE 0.9% FLUSH: 3.0000 mL | Freq: Two times a day (BID) | INTRAVENOUS | Status: DC

## 2021-10-01 MED ORDER — HEPARIN SODIUM (PORCINE) 1000 UNIT/ML IJ SOLN
INTRAMUSCULAR | Status: DC | PRN
  Administered 2021-10-01: 5000 [IU] via INTRAVENOUS

## 2021-10-01 MED ORDER — FENTANYL CITRATE (PF) 100 MCG/2ML IJ SOLN
INTRAMUSCULAR | Status: AC
  Filled 2021-10-01: qty 2

## 2021-10-01 MED ORDER — ASPIRIN 81 MG PO CHEW: 81.0000 mg | CHEWABLE_TABLET | ORAL | Status: AC

## 2021-10-01 MED ORDER — HEPARIN SODIUM (PORCINE) 1000 UNIT/ML IJ SOLN
INTRAMUSCULAR | Status: AC
  Filled 2021-10-01: qty 1

## 2021-10-01 MED ORDER — SODIUM CHLORIDE 0.9 % IV SOLN: 250.0000 mL | INTRAVENOUS | Status: DC | PRN

## 2021-10-01 MED ORDER — LIDOCAINE HCL (PF) 1 % IJ SOLN
INTRAMUSCULAR | Status: DC | PRN
  Administered 2021-10-01: 5 mL via SUBCUTANEOUS

## 2021-10-01 MED ORDER — FENTANYL CITRATE (PF) 100 MCG/2ML IJ SOLN
INTRAMUSCULAR | Status: DC | PRN
  Administered 2021-10-01 (×2): 25 ug via INTRAVENOUS

## 2021-10-01 MED ORDER — VERAPAMIL HCL 2.5 MG/ML IV SOLN
INTRAVENOUS | Status: AC
  Filled 2021-10-01: qty 2

## 2021-10-01 MED ORDER — ASPIRIN 81 MG PO CHEW
CHEWABLE_TABLET | ORAL | Status: AC
  Administered 2021-10-01: 81 mg via ORAL
  Filled 2021-10-01: qty 1

## 2021-10-01 MED ORDER — LIDOCAINE HCL 1 % IJ SOLN
INTRAMUSCULAR | Status: AC
  Filled 2021-10-01: qty 20

## 2021-10-01 MED ORDER — SODIUM CHLORIDE 0.9% FLUSH: 3.0000 mL | INTRAVENOUS | Status: DC | PRN

## 2021-10-01 MED ORDER — VERAPAMIL HCL 2.5 MG/ML IV SOLN
INTRAVENOUS | Status: DC | PRN
  Administered 2021-10-01: 2.5 mg via INTRA_ARTERIAL

## 2021-10-01 MED ORDER — MIDAZOLAM HCL 2 MG/2ML IJ SOLN
INTRAMUSCULAR | Status: AC
  Filled 2021-10-01: qty 2

## 2021-10-01 SURGICAL SUPPLY — 11 items
CATH BALLN WEDGE 5F 110CM (CATHETERS) ×1 IMPLANT
CATH INFINITI 5FR JK (CATHETERS) ×1 IMPLANT
DRAPE BRACHIAL (DRAPES) ×2 IMPLANT
GLIDESHEATH SLEND SS 6F .021 (SHEATH) ×1 IMPLANT
GUIDEWIRE INQWIRE 1.5J.035X260 (WIRE) IMPLANT
INQWIRE 1.5J .035X260CM (WIRE) ×2
PACK CARDIAC CATH (CUSTOM PROCEDURE TRAY) ×2 IMPLANT
PROTECTION STATION PRESSURIZED (MISCELLANEOUS) ×2
SET ATX SIMPLICITY (MISCELLANEOUS) ×1 IMPLANT
SHEATH GLIDE SLENDER 4/5FR (SHEATH) ×1 IMPLANT
STATION PROTECTION PRESSURIZED (MISCELLANEOUS) IMPLANT

## 2021-10-01 NOTE — Telephone Encounter (Signed)
Cath lab calling States patient is upset that he received no instructions for his cath that he had this morning They wanted to make office aware and asked that we review notes

## 2021-10-01 NOTE — Telephone Encounter (Signed)
Tom Wilkerson, patient mother in law calling Wants to make sure that Dr Fletcher Anon is doing cath this morning  States that they were told Dr Harlow Mares was doing surgery and they are concerned as this is an orthopedic provider Please call to clarify ASAP

## 2021-10-01 NOTE — Telephone Encounter (Signed)
Spoke to the patient's mother per the dpr. She has been advised that the patient was given instructions the day of his office visit and that the instructions were gone over with the patient. He was advised to call if he had any questions concerning these instructions (please note avs given to patient in his chart). Instructions were also available through [place].   She stated that she did not have any questions concerning the cath and was aware that Dr. Fletcher Anon was scheduled to do the procedure today as planned.

## 2021-10-01 NOTE — Interval H&P Note (Signed)
History and Physical Interval Note:  10/01/2021 9:59 AM  Tom Wilkerson  has presented today for surgery, with the diagnosis of RT LT Heart Cath   Stable angina.  The various methods of treatment have been discussed with the patient and family. After consideration of risks, benefits and other options for treatment, the patient has consented to  Procedure(s): RIGHT/LEFT HEART CATH AND CORONARY ANGIOGRAPHY (N/A) as a surgical intervention.  The patient's history has been reviewed, patient examined, no change in status, stable for surgery.  I have reviewed the patient's chart and labs.  Questions were answered to the patient's satisfaction.     Kathlyn Sacramento

## 2021-10-12 NOTE — Progress Notes (Deleted)
Cardiology Office Note    Date:  10/12/2021   ID:  Tom Wilkerson, DOB 10-May-1966, MRN [id number]  PCP:  [name], MD  Cardiologist:  Kathlyn Sacramento, MD  Electrophysiologist:  None   Chief Complaint: Follow-up  History of Present Illness:   Tom Wilkerson is a 55 y.o. male with history of ***  ***   Labs independently reviewed: 08/2021 - BUN 17, serum creatinine 1.25, potassium 4.9, Hgb 14.0, PLT 241, TSH normal, albumin 4.3, AST/ALT normal 10/2020 - A1c 5.9, TC 166, TG 104, HDL 31, LDL 114  Past Medical History:  Diagnosis Date   Allergic rhinitis, cause unspecified    Arthritis of neck    Arthritis of shoulder    "right shoulder blade" per patient   Chest pain, unspecified    Chicken pox    Chronic prostatitis    Clostridium difficile colitis    following course of amoxicillin 10/2012 then Cefdinir 10/2013; recurrent C. difficile after Flagyl and intolerant to Vancoycin; s/p Dificid followed by fecal transplant 10/28/13   Elevated blood pressure reading without diagnosis of hypertension    Esophageal reflux    Hypertension    Insomnia, unspecified    Irritability    Nonspecific abnormal electrocardiogram (ECG) (EKG)    Other alopecia    Other and unspecified hyperlipidemia    Other testicular hypofunction    Sleep apnea    03/07/2020: "about 3-4 years ago, wears CPAP every night"   Sleep related leg cramps    Unspecified contusion of eye    Unspecified hearing loss    Unspecified hyperplasia of prostate without urinary obstruction and other lower urinary tract symptoms (LUTS)     Past Surgical History:  Procedure Laterality Date   ANTERIOR CERVICAL DECOMPRESSION/DISCECTOMY FUSION 4 LEVELS N/A 03/09/2020   Procedure: ANTERIOR CERVICAL DECOMPRESSION/DISCECTOMY FUSION, INTERBODY PROSTHESIS, PLATE/SCREWS,CERVICAL THREE-FOUR,CERVICAL FOUR-FIVE, CERVICAL FIVE-SIX,CERVICAL SIX-SEVEN;  Surgeon: Newman Pies, MD;  Location: [facility];  Service: Neurosurgery;   Laterality: N/A;  anterior   DENTAL RESTORATION/EXTRACTION WITH X-RAY     RIGHT/LEFT HEART CATH AND CORONARY ANGIOGRAPHY N/A 10/01/2021   Procedure: RIGHT/LEFT HEART CATH AND CORONARY ANGIOGRAPHY;  Surgeon: Wellington Hampshire, MD;  Location: [facility];  Service: Cardiovascular;  Laterality: N/A;   TONSILLECTOMY  01/31/2009   Richardson Landry    Current Medications: No outpatient medications have been marked as taking for the 10/19/21 encounter (Appointment) with Rise Mu, PA-C.    Allergies:   Ibuprofen, Other, Bee venom, Codeine, Nsaids, and Amlodipine   Social History   Socioeconomic History   Marital status: Married    Spouse name: Not on file   Number of children: Not on file   Years of education: Not on file   Highest education level: Not on file  Occupational History   Occupation: works from home    Comment: small [place] in [employer] x 12 years; happy  Tobacco Use   Smoking status: Never   Smokeless tobacco: Never  Vaping Use   Vaping Use: Never used  Substance and Sexual Activity   Alcohol use: Yes    Comment: occasional socially twice weekly beer   Drug use: No   Sexual activity: Yes  Other Topics Concern   Not on file  Social History Narrative   Always uses seat belts. Smoke alarm and carbon monoxide detector in the home.Guns in the home stored in locked cabinet. Caffeine use Large more than 8 servings per day. Married x 23 years,  happily married,no children.Nutrtion Poorly balanced diet. Exercise: moderate 3 x week, walking,cycling. Pets: cat, dog   Social Determinants of Radio broadcast assistant Strain: Not on file  Food Insecurity: Not on file  Transportation Needs: Not on file  Physical Activity: Not on file  Stress: Not on file  Social Connections: Not on file     Family History:  The patient's family history includes Arthritis in his mother; CAD in his father; Cancer in his father, mother, and another family member; Congestive  Heart Failure in his father; Depression in his mother; Hyperlipidemia in his father and mother; Hypertension in his father and mother; Kidney cancer in his mother; Liver disease in his brother; Lung cancer in his mother; Migraines in his sister; Prostate cancer in his father; Stroke in his father; Ulcerative colitis in his brother. There is no history of COPD, Diabetes, or Heart disease.  ROS:   ROS   EKGs/Labs/Other Studies Reviewed:    Studies reviewed were summarized above. The additional studies were reviewed today:  R/LHC 10/01/2021: 1.  Normal coronary arteries. 2.  Right heart catheterization showed mildly elevated right and left-sided filling pressures, high normal pulmonary pressure and normal cardiac output.   Recommendations: The patient might have mild degree of diastolic heart failure but suspect that the majority of his symptoms are noncardiac. __________  2D echo 09/20/2021: 1. Left ventricular ejection fraction, by estimation, is 60 to 65%. The  left ventricle has normal function. The left ventricle has no regional  wall motion abnormalities. Left ventricular diastolic parameters are  consistent with Grade I diastolic  dysfunction (impaired relaxation).   2. Right ventricular systolic function is normal. The right ventricular  size is normal. There is normal pulmonary artery systolic pressure. The  estimated right ventricular systolic pressure is 08.1 mmHg.   3. The mitral valve is normal in structure. No evidence of mitral valve  regurgitation. No evidence of mitral stenosis.   4. The aortic valve is normal in structure. Trileaflet. Aortic valve  regurgitation is not visualized. No aortic stenosis is present.   5. The inferior vena cava is normal in size with greater than 50%  respiratory variability, suggesting right atrial pressure of 3 mmHg.   6. Left atrial size was mildly dilated.    EKG:  EKG is ordered today.  The EKG ordered today demonstrates  ***  Recent Labs: 09/11/2021: BUN 17; Creatinine, Ser 1.25; Hemoglobin 14.0; Platelets 241; Potassium 4.9; Sodium 140  Recent Lipid Panel    Component Value Date/Time   CHOL 141 03/27/2018 1514   TRIG 95 03/27/2018 1514   TRIG 113 06/12/2015 0839   HDL 37 (L) 03/27/2018 1514   HDL 36 (L) 06/12/2015 0839   CHOLHDL 3.8 03/27/2018 1514   VLDL 21 01/31/2017 0853   LDLCALC 85 03/27/2018 1514    PHYSICAL EXAM:    VS:  There were no vitals taken for this visit.  BMI: There is no height or weight on file to calculate BMI.  Physical Exam  Wt Readings from Last 3 Encounters:  10/01/21 240 lb (108.9 kg)  09/11/21 242 lb (109.8 kg)  04/19/20 238 lb (108 kg)     ASSESSMENT & PLAN:   ***   {Are you ordering a CV Procedure (e.g. stress test, cath, DCCV, TEE, etc)?   Press F2        :[id number]}     Disposition: F/u with Dr. Fletcher Anon or an APP in ***.   Medication Adjustments/Labs and Tests Ordered:  Current medicines are reviewed at length with the patient today.  Concerns regarding medicines are outlined above. Medication changes, Labs and Tests ordered today are summarized above and listed in the Patient Instructions accessible in Encounters.   Signed, Christell Faith, PA-C 10/12/2021 3:02 PM     [address] [place] [address] [place], [place] [address] [phone]

## 2021-10-19 ENCOUNTER — Ambulatory Visit: Payer: 59 | Admitting: Physician Assistant

## 2021-10-26 ENCOUNTER — Other Ambulatory Visit: Payer: Self-pay

## 2021-10-26 ENCOUNTER — Encounter: Payer: Self-pay | Admitting: Cardiovascular Disease

## 2021-10-26 ENCOUNTER — Ambulatory Visit (INDEPENDENT_AMBULATORY_CARE_PROVIDER_SITE_OTHER): Payer: 59 | Admitting: Cardiovascular Disease

## 2021-10-26 VITALS — BP 128/72 | HR 73 | Ht 72.0 in | Wt 244.1 lb

## 2021-10-26 DIAGNOSIS — Z9989 Dependence on other enabling machines and devices: Secondary | ICD-10-CM

## 2021-10-26 DIAGNOSIS — R0609 Other forms of dyspnea: Secondary | ICD-10-CM | POA: Diagnosis not present

## 2021-10-26 DIAGNOSIS — I10 Essential (primary) hypertension: Secondary | ICD-10-CM | POA: Diagnosis not present

## 2021-10-26 DIAGNOSIS — G4733 Obstructive sleep apnea (adult) (pediatric): Secondary | ICD-10-CM | POA: Diagnosis not present

## 2021-10-26 DIAGNOSIS — E785 Hyperlipidemia, unspecified: Secondary | ICD-10-CM | POA: Diagnosis not present

## 2021-10-26 NOTE — Progress Notes (Signed)
Cardiology Office Note   Date:  10/26/2021   ID:  MORSE BRUEGGEMANN, DOB 1966-09-05, MRN [id number]  PCP:  [name], MD  Cardiologist:   Kathlyn Sacramento, MD   Chief Complaint  Patient presents with   Other    Post cardiac cath no complaints today. Meds reviewed verbally with pt.      History of Present Illness: Tom Wilkerson is a 55 y.o. male who is here today for follow-up visit regarding exertional chest pain and shortness of breath.    He has no prior cardiac history.  He had previous spinal fusion in May 2021 that was then complicated by right leg DVT.  He was treated with anticoagulation for 6 months.  Other medical issues include essential hypertension, hyperlipidemia, sleep apnea on CPAP and obesity.  He is not a smoker.  He has family history of premature coronary artery disease.  One of his brothers had myocardial infarction and CABG in his 74s even though he was a marathon runner.  His father had CABG in his early 25s.  The patient has known history of testosterone deficiency and takes testosterone injection which caused increased hemoglobin. He has been under significant stress at work.  He was seen recently for dyspnea with minimal exertion with associated dizziness and excessive sweating and intermittent episodes of chest discomfort.  He underwent an echocardiogram which showed normal LV systolic function, grade 1 diastolic dysfunction, no evidence of pulmonary hypertension and no significant valvular abnormalities. I proceeded with a right and left cardiac catheterization which showed normal coronary arteries.  Right heart catheterization showed mildly elevated right and left-sided filling pressures, high normal pulmonary pressures and normal cardiac output. He has been doing reasonably well overall.  He reports continued shortness of breath but no chest pain.   Past Medical History:  Diagnosis Date   Allergic rhinitis, cause unspecified    Arthritis of neck     Arthritis of shoulder    "right shoulder blade" per patient   Chest pain, unspecified    Chicken pox    Chronic prostatitis    Clostridium difficile colitis    following course of amoxicillin 10/2012 then Cefdinir 10/2013; recurrent C. difficile after Flagyl and intolerant to Vancoycin; s/p Dificid followed by fecal transplant 10/28/13   Elevated blood pressure reading without diagnosis of hypertension    Esophageal reflux    Hypertension    Insomnia, unspecified    Irritability    Nonspecific abnormal electrocardiogram (ECG) (EKG)    Other alopecia    Other and unspecified hyperlipidemia    Other testicular hypofunction    Sleep apnea    03/07/2020: "about 3-4 years ago, wears CPAP every night"   Sleep related leg cramps    Unspecified contusion of eye    Unspecified hearing loss    Unspecified hyperplasia of prostate without urinary obstruction and other lower urinary tract symptoms (LUTS)     Past Surgical History:  Procedure Laterality Date   ANTERIOR CERVICAL DECOMPRESSION/DISCECTOMY FUSION 4 LEVELS N/A 03/09/2020   Procedure: ANTERIOR CERVICAL DECOMPRESSION/DISCECTOMY FUSION, INTERBODY PROSTHESIS, PLATE/SCREWS,CERVICAL THREE-FOUR,CERVICAL FOUR-FIVE, CERVICAL FIVE-SIX,CERVICAL SIX-SEVEN;  Surgeon: Newman Pies, MD;  Location: [facility];  Service: Neurosurgery;  Laterality: N/A;  anterior   DENTAL RESTORATION/EXTRACTION WITH X-RAY     RIGHT/LEFT HEART CATH AND CORONARY ANGIOGRAPHY N/A 10/01/2021   Procedure: RIGHT/LEFT HEART CATH AND CORONARY ANGIOGRAPHY;  Surgeon: Wellington Hampshire, MD;  Location: [facility];  Service: Cardiovascular;  Laterality: N/A;  TONSILLECTOMY  01/31/2009   Richardson Landry     Current Outpatient Medications  Medication Sig Dispense Refill   acetaminophen (TYLENOL) 500 MG tablet Take 1,000 mg by mouth every 6 (six) hours as needed for mild pain or headache.      AMBULATORY NON FORMULARY MEDICATION Patient has OSA or probable OSA (Yes or No): yes Is  the patient currently using CPAP within the home? (Yes or No): yes If yes to question 2, what is the current DME provider?  Are there any changes to the order/settings? (if yes, please specify) no If no to question 2, date of sleep study: 10/22/17 Date of face-to-face encounter: 01/13/2018 Settings: auto 5-20 cm h2O Signs and symptoms or probable OSA, mention all that apply (snoring) (morning  Headaches) (witnessed apneas) (choking) (gasping during sleep): snoring, apnea Resmed S/O air/auto with heated humidity.   Enroll in [place] [facility] [employer] CPAP supplies needed: mask of choice, headgear, cushions, filters, climate control tubing and water chamber. 1 each 0   augmented betamethasone dipropionate (DIPROLENE-AF) 0.05 % cream      buPROPion (WELLBUTRIN XL) 150 MG 24 hr tablet TAKE ONE TABLET BY MOUTH EVERY DAY 90 tablet 3   Carboxymethylcell-Hypromellose (GENTEAL) 0.25-0.3 % GEL Place 1 application into both eyes 2 (two) times daily as needed (for dryness).     Coenzyme Q10 (COQ-10) 100 MG CAPS Take 100 mg by mouth at bedtime.     cyclobenzaprine (FLEXERIL) 10 MG tablet Take 1 tablet (10 mg total) by mouth 3 (three) times daily as needed for muscle spasms. 30 tablet 0   Eszopiclone 3 MG TABS Take 3 mg by mouth at bedtime as needed (insomnia/sleep). Take immediately before bedtime     fluticasone (FLONASE) 50 MCG/ACT nasal spray Place 1 spray into both nostrils 2 (two) times daily as needed for allergies or rhinitis.     gabapentin (NEURONTIN) 100 MG capsule Take 2 capsules (200 mg total) by mouth at bedtime. (Patient taking differently: Take 200-300 mg by mouth at bedtime as needed (neuropathic pain).) 180 capsule 3   LORazepam (ATIVAN) 0.5 MG tablet Take 0.5 mg by mouth 2 (two) times daily as needed for anxiety.     pravastatin (PRAVACHOL) 20 MG tablet TAKE 1 TABLET EVERY OTHER DAY AT BEDTIME (Patient taking differently: Take 20 mg by mouth at bedtime.) 45 tablet 0   PRESCRIPTION  MEDICATION CPAP- At bedtime     Probiotic Product (PROBIOTIC PO) Take 1 capsule by mouth in the morning.     sertraline (ZOLOFT) 100 MG tablet Take 100 mg by mouth in the morning.     sildenafil (REVATIO) 20 MG tablet Take 20 mg by mouth daily as needed (erectile dysfunction).     sodium chloride (OCEAN) 0.65 % SOLN nasal spray Place 1 spray into both nostrils as needed for congestion (prevent nasal passage dryness).     tamsulosin (FLOMAX) 0.4 MG CAPS capsule Take 0.4 mg by mouth daily as needed (urinary retention). In the evening     testosterone cypionate (DEPOTESTOSTERONE CYPIONATE) 200 MG/ML injection Inject 140 mg into the muscle every 14 (fourteen) days.     valsartan (DIOVAN) 320 MG tablet Take 320 mg by mouth every evening.     No current facility-administered medications for this visit.    Allergies:   Ibuprofen, Other, Bee venom, Codeine, Nsaids, and Amlodipine    Social History:  The patient  reports that he has never smoked. He has never used smokeless tobacco. He reports current alcohol use.  He reports that he does not use drugs.   Family History:  The patient's family history includes Arthritis in his mother; CAD in his father; Cancer in his father, mother, and another family member; Congestive Heart Failure in his father; Depression in his mother; Hyperlipidemia in his father and mother; Hypertension in his father and mother; Kidney cancer in his mother; Liver disease in his brother; Lung cancer in his mother; Migraines in his sister; Prostate cancer in his father; Stroke in his father; Ulcerative colitis in his brother.    ROS:  Please see the history of present illness.   Otherwise, review of systems are positive for none.   All other systems are reviewed and negative.    PHYSICAL EXAM: VS:  BP 128/72 (BP Location: Left Arm, Patient Position: Sitting, Cuff Size: Large)   Pulse 73   Ht 6' (1.829 m)   Wt 244 lb 2 oz (110.7 kg)   SpO2 98%   BMI 33.11 kg/m  , BMI Body mass  index is 33.11 kg/m. GEN: Well nourished, well developed, in no acute distress  HEENT: normal  Neck: no JVD, carotid bruits, or masses Cardiac: RRR; no murmurs, rubs, or gallops,no edema  Respiratory:  clear to auscultation bilaterally, normal work of breathing GI: soft, nontender, nondistended, + BS MS: no deformity or atrophy  Skin: warm and dry, no rash Neuro:  Strength and sensation are intact Psych: euthymic mood, full affect Right radial pulses normal with no hematoma.   EKG:  EKG is ordered today. The ekg ordered today demonstrates normal sinus rhythm with no significant ST or T wave changes.   Recent Labs: 09/11/2021: BUN 17; Creatinine, Ser 1.25; Hemoglobin 14.0; Platelets 241; Potassium 4.9; Sodium 140    Lipid Panel    Component Value Date/Time   CHOL 141 03/27/2018 1514   TRIG 95 03/27/2018 1514   TRIG 113 06/12/2015 0839   HDL 37 (L) 03/27/2018 1514   HDL 36 (L) 06/12/2015 0839   CHOLHDL 3.8 03/27/2018 1514   VLDL 21 01/31/2017 0853   LDLCALC 85 03/27/2018 1514      Wt Readings from Last 3 Encounters:  10/26/21 244 lb 2 oz (110.7 kg)  10/01/21 240 lb (108.9 kg)  09/11/21 242 lb (109.8 kg)       No flowsheet data found.    ASSESSMENT AND PLAN:  1.  Exertional dyspnea: Seems to be not cardiac in origin based on the results of recent right and left cardiac catheterization.  I agree with pulmonary evaluation.  There is a possibility of physical deconditioning contributing to his symptoms.  I discussed with him the importance of starting an exercise program.  2.  Sleep apnea: Uses CPAP on a regular basis.  3.  Essential hypertension: Blood pressure is controlled.  4.  Hyperlipidemia: Currently on pravastatin with most recent LDL of 115.    5.  Obesity: I discussed the importance of controlling risk factors, healthy diet as well as regular exercise.   Disposition:   Follow-up with me as needed.    Signed,  Kathlyn Sacramento, MD  10/26/2021 3:48 PM     [place]

## 2021-10-26 NOTE — Patient Instructions (Signed)
Medication Instructions:  Your physician recommends that you continue on your current medications as directed. Please refer to the Current Medication list given to you today.  *If you need a refill on your cardiac medications before your next appointment, please call your pharmacy*   Lab Work: None ordered If you have labs (blood work) drawn today and your tests are completely normal, you will receive your results only by: [facility] (if you have MyChart) OR A paper copy in the mail If you have any lab test that is abnormal or we need to change your treatment, we will call you to review the results.   Testing/Procedures: None ordered   Follow-Up: At [facility], you and your health needs are our priority.  As part of our continuing mission to provide you with exceptional heart care, we have created designated Provider Care Teams.  These Care Teams include your primary Cardiologist (physician) and Advanced Practice Providers (APPs -  Physician Assistants and Nurse Practitioners) who all work together to provide you with the care you need, when you need it.  We recommend signing up for the patient portal called "MyChart".  Sign up information is provided on this After Visit Summary.  MyChart is used to connect with patients for Virtual Visits (Telemedicine).  Patients are able to view lab/test results, encounter notes, upcoming appointments, etc.  Non-urgent messages can be sent to your provider as well.   To learn more about what you can do with MyChart, go to [email or web address].    Your next appointment:   As needed  The format for your next appointment:   In Person  Provider:   You may see Kathlyn Sacramento, MD or one of the following Advanced Practice Providers on your designated Care Team:   Murray Hodgkins, NP Christell Faith, PA-C Cadence Kathlen Mody, PA-C  :1}    Other Instructions N/A

## 2022-10-31 ENCOUNTER — Other Ambulatory Visit: Payer: Self-pay | Admitting: Nurse Practitioner

## 2022-10-31 DIAGNOSIS — R1313 Dysphagia, pharyngeal phase: Secondary | ICD-10-CM

## 2022-11-08 ENCOUNTER — Ambulatory Visit
Admission: RE | Admit: 2022-11-08 | Discharge: 2022-11-08 | Disposition: A | Discharge status: Home / Self Care | Payer: 59 | Source: Ambulatory Visit | Attending: Nurse Practitioner | Admitting: Nurse Practitioner

## 2022-11-08 DIAGNOSIS — R1313 Dysphagia, pharyngeal phase: Secondary | ICD-10-CM | POA: Diagnosis present

## 2022-12-19 ENCOUNTER — Other Ambulatory Visit: Payer: Self-pay | Admitting: Nurse Practitioner

## 2022-12-19 DIAGNOSIS — Z83719 Family history of colon polyps, unspecified: Secondary | ICD-10-CM

## 2023-01-24 ENCOUNTER — Ambulatory Visit: Payer: 59

## 2023-03-24 ENCOUNTER — Ambulatory Visit
Admission: RE | Admit: 2023-03-24 | Discharge: 2023-03-24 | Disposition: A | Discharge status: Home / Self Care | Payer: 59 | Source: Ambulatory Visit | Attending: Nurse Practitioner | Admitting: Nurse Practitioner

## 2023-03-24 DIAGNOSIS — Z83719 Family history of colon polyps, unspecified: Secondary | ICD-10-CM

## 2023-04-30 ENCOUNTER — Ambulatory Visit
Admission: RE | Admit: 2023-04-30 | Discharge: 2023-04-30 | Disposition: A | Discharge status: Home / Self Care | Payer: 59 | Source: Ambulatory Visit | Attending: Nurse Practitioner | Admitting: Nurse Practitioner

## 2024-05-25 ENCOUNTER — Ambulatory Visit: Admitting: Anesthesiology

## 2024-10-18 ENCOUNTER — Ambulatory Visit: Attending: Anesthesiology | Admitting: Anesthesiology

## 2024-10-18 ENCOUNTER — Encounter: Payer: Self-pay | Admitting: Anesthesiology

## 2024-10-18 VITALS — BP 121/70 | HR 80 | Temp 96.5°F | Resp 18 | Ht 72.0 in | Wt 210.0 lb

## 2024-10-18 DIAGNOSIS — Z981 Arthrodesis status: Secondary | ICD-10-CM | POA: Insufficient documentation

## 2024-10-18 DIAGNOSIS — M79603 Pain in arm, unspecified: Secondary | ICD-10-CM | POA: Insufficient documentation

## 2024-10-18 DIAGNOSIS — I10 Essential (primary) hypertension: Secondary | ICD-10-CM | POA: Diagnosis not present

## 2024-10-18 DIAGNOSIS — H9190 Unspecified hearing loss, unspecified ear: Secondary | ICD-10-CM | POA: Insufficient documentation

## 2024-10-18 DIAGNOSIS — G4730 Sleep apnea, unspecified: Secondary | ICD-10-CM | POA: Insufficient documentation

## 2024-10-18 DIAGNOSIS — M545 Low back pain, unspecified: Secondary | ICD-10-CM | POA: Insufficient documentation

## 2024-10-18 DIAGNOSIS — M546 Pain in thoracic spine: Secondary | ICD-10-CM

## 2024-10-18 DIAGNOSIS — Z8249 Family history of ischemic heart disease and other diseases of the circulatory system: Secondary | ICD-10-CM | POA: Diagnosis not present

## 2024-10-18 DIAGNOSIS — M5030 Other cervical disc degeneration, unspecified cervical region: Secondary | ICD-10-CM | POA: Insufficient documentation

## 2024-10-18 DIAGNOSIS — G4762 Sleep related leg cramps: Secondary | ICD-10-CM | POA: Diagnosis not present

## 2024-10-18 DIAGNOSIS — M4722 Other spondylosis with radiculopathy, cervical region: Secondary | ICD-10-CM | POA: Insufficient documentation

## 2024-10-18 DIAGNOSIS — M51369 Other intervertebral disc degeneration, lumbar region without mention of lumbar back pain or lower extremity pain: Secondary | ICD-10-CM | POA: Insufficient documentation

## 2024-10-18 DIAGNOSIS — E785 Hyperlipidemia, unspecified: Secondary | ICD-10-CM | POA: Insufficient documentation

## 2024-10-18 DIAGNOSIS — M19019 Primary osteoarthritis, unspecified shoulder: Secondary | ICD-10-CM | POA: Diagnosis not present

## 2024-10-18 DIAGNOSIS — M1909 Primary osteoarthritis, other specified site: Secondary | ICD-10-CM | POA: Insufficient documentation

## 2024-10-18 DIAGNOSIS — M5136 Other intervertebral disc degeneration, lumbar region with discogenic back pain only: Secondary | ICD-10-CM | POA: Insufficient documentation

## 2024-10-18 DIAGNOSIS — M542 Cervicalgia: Secondary | ICD-10-CM

## 2024-10-18 MED ORDER — DEXAMETHASONE SOD PHOSPHATE PF 10 MG/ML IJ SOLN
10.0000 mg | Freq: Once | INTRAMUSCULAR | Status: AC
  Administered 2024-10-18: 10 mg

## 2024-10-18 MED ORDER — ROPIVACAINE HCL 2 MG/ML IJ SOLN
10.0000 mL | Freq: Once | INTRAMUSCULAR | Status: DC
  Filled 2024-10-18: qty 20

## 2024-10-18 NOTE — Progress Notes (Unsigned)
 Safety precautions to be maintained throughout the outpatient stay will include: orient to surroundings, keep bed in low position, maintain call bell within reach at all times, provide assistance with transfer out of bed and ambulation.

## 2024-10-20 NOTE — Progress Notes (Signed)
 Subjective:  Patient ID: Tom Wilkerson, male    DOB: 04/30/1966  Age: 58 y.o. MRN: [id number]  CC: Back Pain (mid)     PROCEDURE: Right mid thoracic T12 trigger point injection  HPI Tom Wilkerson presents for a new patient evaluation.  Positive longstanding history of low back pain thoracic back pain and some cervical neck pain.  He presented for a new patient evaluation back in 2021 but returns today for evaluation complaining of low back pain.  He describes an aching gnawing pain particularly prevalent in the right thoracic region that has been severe and incapacitating.  In the past he has been seen by pain management and had an injection in the muscle around the rib area that gave him instant relief of a sustained nature.  The spasming does not radiate into the hip or buttock region.  He does not complain of any lower extremity sciatica symptoms or weakness.  His neck pain has been stable in nature following a 4 level cervical fusion and this alleviated a lot of the lancinating arm pain that he had previously though he still has intermittent centralized cervical pain that is generally well-controlled with conservative measures.  History Tom Wilkerson has a past medical history of Allergic rhinitis, cause unspecified, Arthritis of neck, Arthritis of shoulder, Chest pain, unspecified, Chicken pox, Chronic prostatitis, Clostridium difficile colitis, Elevated blood pressure reading without diagnosis of hypertension, Esophageal reflux, Hypertension, Insomnia, unspecified, Irritability, Nonspecific abnormal electrocardiogram (ECG) (EKG), Other alopecia, Other and unspecified hyperlipidemia, Other testicular hypofunction, Sleep apnea, Sleep related leg cramps, Unspecified contusion of eye, Unspecified hearing loss, and Unspecified hyperplasia of prostate without urinary obstruction and other lower urinary tract symptoms (LUTS).   He has a past surgical history that includes Tonsillectomy (01/31/2009); Dental  restoration/extraction with x-ray; Anterior cervical decompression/discectomy fusion 4 level (N/A, 03/09/2020); and RIGHT/LEFT HEART CATH AND CORONARY ANGIOGRAPHY (N/A, 10/01/2021).   His family history includes Arthritis in his mother; CAD in his father; Cancer in his father, mother, and another family member; Congestive Heart Failure in his father; Depression in his mother; Hyperlipidemia in his father and mother; Hypertension in his father and mother; Kidney cancer in his mother; Liver disease in his brother; Lung cancer in his mother; Migraines in his sister; Prostate cancer in his father; Stroke in his father; Ulcerative colitis in his brother.He reports that he has never smoked. He has never used smokeless tobacco. He reports current alcohol use. He reports that he does not use drugs.  No results found for this or any previous visit.   No results found for: TOXASSSELUR  Outpatient Medications Prior to Visit  Medication Sig Dispense Refill   acetaminophen  (TYLENOL ) 500 MG tablet Take 1,000 mg by mouth every 6 (six) hours as needed for mild pain or headache.      AMBULATORY NON FORMULARY MEDICATION Patient has OSA or probable OSA (Yes or No): yes Is the patient currently using CPAP within the home? (Yes or No): yes If yes to question 2, what is the current DME provider?  Are there any changes to the order/settings? (if yes, please specify) no If no to question 2, date of sleep study: 10/22/17 Date of face-to-face encounter: 01/13/2018 Settings: auto 5-20 cm h2O Signs and symptoms or probable OSA, mention all that apply (snoring) (morning  Headaches) (witnessed apneas) (choking) (gasping during sleep): snoring, apnea Resmed S/O air/auto with heated humidity.   Enroll in [employer] CPAP supplies needed: mask of choice, headgear, cushions, filters, climate  control tubing and water chamber. 1 each 0   buPROPion  (WELLBUTRIN  XL) 150 MG 24 hr tablet TAKE ONE TABLET BY MOUTH  EVERY DAY 90 tablet 3   Coenzyme Q10 (COQ-10) 100 MG CAPS Take 100 mg by mouth at bedtime.     cyclobenzaprine  (FLEXERIL ) 10 MG tablet Take 1 tablet (10 mg total) by mouth 3 (three) times daily as needed for muscle spasms. 30 tablet 0   Eszopiclone 3 MG TABS Take 3 mg by mouth at bedtime as needed (insomnia/sleep). Take immediately before bedtime     fluticasone  (FLONASE) 50 MCG/ACT nasal spray Place 1 spray into both nostrils 2 (two) times daily as needed for allergies or rhinitis.     gabapentin  (NEURONTIN ) 100 MG capsule Take 2 capsules (200 mg total) by mouth at bedtime. 180 capsule 3   gabapentin  (NEURONTIN ) 300 MG capsule Take 300 mg by mouth at bedtime.     LORazepam  (ATIVAN ) 0.5 MG tablet Take 0.5 mg by mouth 2 (two) times daily as needed for anxiety.     pravastatin  (PRAVACHOL ) 20 MG tablet TAKE 1 TABLET EVERY OTHER DAY AT BEDTIME 45 tablet 0   PRESCRIPTION MEDICATION CPAP- At bedtime     Probiotic Product (PROBIOTIC PO) Take 1 capsule by mouth in the morning.     sildenafil (REVATIO) 20 MG tablet Take 20 mg by mouth daily as needed (erectile dysfunction).     sodium chloride  (OCEAN) 0.65 % SOLN nasal spray Place 1 spray into both nostrils as needed for congestion (prevent nasal passage dryness).     tamsulosin  (FLOMAX ) 0.4 MG CAPS capsule Take 0.4 mg by mouth daily as needed (urinary retention). In the evening     testosterone  cypionate (DEPOTESTOSTERONE CYPIONATE) 200 MG/ML injection Inject 140 mg into the muscle every 14 (fourteen) days.     valsartan (DIOVAN) 320 MG tablet Take 320 mg by mouth every evening.     augmented betamethasone dipropionate (DIPROLENE-AF) 0.05 % cream  (Patient not taking: Reported on 10/18/2024)     Carboxymethylcell-Hypromellose (GENTEAL) 0.25-0.3 % GEL Place 1 application into both eyes 2 (two) times daily as needed (for dryness).     sertraline (ZOLOFT) 100 MG tablet Take 100 mg by mouth in the morning.     No facility-administered medications prior to  visit.   Lab Results  Component Value Date   WBC 5.3 09/11/2021   HGB 14.0 09/11/2021   HCT 43.5 09/11/2021   PLT 241 09/11/2021   GLUCOSE 104 (H) 09/11/2021   CHOL 141 03/27/2018   TRIG 95 03/27/2018   HDL 37 (L) 03/27/2018   LDLCALC 85 03/27/2018   ALT 15 03/27/2018   AST 14 03/27/2018   NA 140 09/11/2021   K 4.9 09/11/2021   CL 101 09/11/2021   CREATININE 1.25 09/11/2021   BUN 17 09/11/2021   CO2 25 09/11/2021   TSH 2.00 08/05/2016   INR 1.1 03/14/2020   HGBA1C 5.6 03/27/2018    --------------------------------------------------------------------------------------------------------------------- CT VIRTUAL COLONOSCOPY SCREENING Result Date: 05/01/2023 CLINICAL DATA:  Incomplete colonoscopy EXAM: CT VIRTUAL COLONOSCOPY SCREENING TECHNIQUE: The patient was given a standard bowel preparation with Gastrografin and barium for fluid and stool tagging respectively. The quality of the bowel preparation is fair. Automated CO2 insufflation of the colon was performed prior to image acquisition and colonic distention is good. Image post processing was used to generate a 3D endoluminal fly-through projection of the colon and to electronically subtract stool/fluid as appropriate. COMPARISON:  12/19/2014 FINDINGS: VIRTUAL COLONOSCOPY Markedly tortuous colon.  No fixed non barium tagged polypoid filling defects or annular constricting lesions. Virtual colonoscopy is not designed to detect diminutive polyps (i.e., less than or equal to 5 mm), the presence or absence of which may not affect clinical management. CT ABDOMEN AND PELVIS WITHOUT CONTRAST Lower chest: No acute abnormality Hepatobiliary: No focal hepatic abnormality. Gallbladder unremarkable. Pancreas: No focal abnormality or ductal dilatation. Spleen: No focal abnormality.  Normal size. Adrenals/Urinary Tract: Small low-density lesion in the upper pole of the left kidney measuring 1.7 cm. 1.8 cm low-density lesion in the midpole of the right  kidney. These are new since prior study and difficult to characterize on this noncontrast study. No hydronephrosis. Adrenal glands and urinary bladder unremarkable. Stomach/Bowel: Stomach and small bowel decompressed, unremarkable. Vascular/Lymphatic: No evidence of aneurysm or adenopathy. Reproductive: Prostate enlargement with central calcifications. Other: No free fluid or free air. Musculoskeletal: No acute bony abnormality. IMPRESSION: Markedly tortuous colon. No visible fixed polypoid filling defects or annular constricting lesions. Bilateral renal low-density lesions measuring up to 1.8 cm, new since prior study and difficult to characterize on this noncontrast study. These could be further characterized with renal ultrasound. Electronically Signed   By: Franky Crease M.D.   On: 05/01/2023 03:26       ---------------------------------------------------------------------------------------------------------------------- Past Medical History:  Diagnosis Date   Allergic rhinitis, cause unspecified    Arthritis of neck    Arthritis of shoulder    right shoulder blade per patient   Chest pain, unspecified    Chicken pox    Chronic prostatitis    Clostridium difficile colitis    following course of amoxicillin 10/2012 then Cefdinir 10/2013; recurrent C. difficile after Flagyl and intolerant to Vancoycin; s/p Dificid followed by fecal transplant 10/28/13   Elevated blood pressure reading without diagnosis of hypertension    Esophageal reflux    Hypertension    Insomnia, unspecified    Irritability    Nonspecific abnormal electrocardiogram (ECG) (EKG)    Other alopecia    Other and unspecified hyperlipidemia    Other testicular hypofunction    Sleep apnea    03/07/2020: about 3-4 years ago, wears CPAP every night   Sleep related leg cramps    Unspecified contusion of eye    Unspecified hearing loss    Unspecified hyperplasia of prostate without urinary obstruction and other lower  urinary tract symptoms (LUTS)     Past Surgical History:  Procedure Laterality Date   ANTERIOR CERVICAL DECOMPRESSION/DISCECTOMY FUSION 4 LEVELS N/A 03/09/2020   Procedure: ANTERIOR CERVICAL DECOMPRESSION/DISCECTOMY FUSION, INTERBODY PROSTHESIS, PLATE/SCREWS,CERVICAL THREE-FOUR,CERVICAL FOUR-FIVE, CERVICAL FIVE-SIX,CERVICAL SIX-SEVEN;  Surgeon: Mavis Purchase, MD;  Location: [facility];  Service: Neurosurgery;  Laterality: N/A;  anterior   DENTAL RESTORATION/EXTRACTION WITH X-RAY     RIGHT/LEFT HEART CATH AND CORONARY ANGIOGRAPHY N/A 10/01/2021   Procedure: RIGHT/LEFT HEART CATH AND CORONARY ANGIOGRAPHY;  Surgeon: Darron Deatrice LABOR, MD;  Location: [facility] INVASIVE CV LAB;  Service: Cardiovascular;  Laterality: N/A;   TONSILLECTOMY  01/31/2009   [name]    Family History  Problem Relation Age of Onset   Hypertension Mother    Lung cancer Mother    Depression Mother    Hyperlipidemia Mother    Arthritis Mother    Kidney cancer Mother    Cancer Mother        kidney, lung   CAD Father        CABG age 86   Hypertension Father    Stroke Father    Hyperlipidemia Father    Prostate  cancer Father    Congestive Heart Failure Father    Cancer Father        prostate   Cancer Other        malignancy prostate   Migraines Sister    Liver disease Brother        on liver transplant list   Ulcerative colitis Brother    COPD Neg Hx    Diabetes Neg Hx    Heart disease Neg Hx     Social History   Tobacco Use   Smoking status: Never   Smokeless tobacco: Never  Substance Use Topics   Alcohol use: Yes    Comment: occasional socially twice weekly beer    ---------------------------------------------------------------------------------------------------------------------  Scheduled Meds: Continuous Infusions:  ropivacaine (PF) 2 mg/mL (0.2%)     PRN Meds:.   BP 121/70   Pulse 80   Temp (!) 96.5 F (35.8 C)   Resp 18   Ht 6' (1.829 m)   Wt 210 lb (95.3 kg)   SpO2 97%   BMI 28.48  kg/m    BP Readings from Last 3 Encounters:  10/18/24 121/70  10/26/21 128/72  10/01/21 111/77     Wt Readings from Last 3 Encounters:  10/18/24 210 lb (95.3 kg)  10/26/21 244 lb 2 oz (110.7 kg)  10/01/21 240 lb (108.9 kg)     ----------------------------------------------------------------------------------------------------------------------  ROS Review of Systems Head: No headaches photophobia Cardiac: No angina or palpitations Pulmonary: No shortness of breath or cough GI: No obstipation constipation Musculoskeletal: Tenderness in the right posterior low thoracic high lumbar back with chronic centralized neck pain  Objective:  BP 121/70   Pulse 80   Temp (!) 96.5 F (35.8 C)   Resp 18   Ht 6' (1.829 m)   Wt 210 lb (95.3 kg)   SpO2 97%   BMI 28.48 kg/m   Physical Exam Patient is alert oriented cooperative compliant he is a good historian. Pulmonary: Lungs are clear to auscultation with no rales or wheezes Heart is regular rate and rhythm without murmur Musculoskeletal reveals tenderness that is reproducible to palpation in the right lower thoracic, lumbar region approximately T12 about 5 cm to the right of midline.  Palpation of this area reveals no masses but tenderness with a trigger point elucidated below the T12 rib.  This does reproduce his primary pain complaint.  He does have tenderness throughout the paraspinous muscles in the lumbar region.  He ambulates well with good muscle tone and bulk to the lower extremities.     Assessment & Plan:   Tom Wilkerson was seen today for back pain.  Diagnoses and all orders for this visit:  Cervicalgia  Cervical spondylosis with radiculopathy  DDD (degenerative disc disease), cervical  Degeneration of intervertebral disc of lumbar region with discogenic back pain  Acute right-sided low back pain without sciatica  Acute right-sided thoracic back pain  Other orders -     dexamethasone  (DECADRON ) injection 10 mg -      ropivacaine (PF) 2 mg/mL (0.2%) (NAROPIN) injection 10 mL     ----------------------------------------------------------------------------------------------------------------------  Problem List Items Addressed This Visit       Unprioritized   Cervical spondylosis with radiculopathy   Relevant Medications   gabapentin  (NEURONTIN ) 300 MG capsule   Cervicalgia - Primary   DDD (degenerative disc disease), cervical   DDD (degenerative disc disease), lumbar   Low back pain   Thoracic back pain    ----------------------------------------------------------------------------------------------------------------------  1. Cervicalgia (Primary) Continue with core stretching  strengthening exercises as discussed with him today.  He is currently doing these.  2. Cervical spondylosis with radiculopathy As above  3. DDD (degenerative disc disease), cervical As above  4. Degeneration of intervertebral disc of lumbar region with discogenic back pain Continue with core stretching physical therapy exercises for the low back.  5. Acute right-sided low back pain without sciatica He has requested a trigger point injection to the area that is bothering him in the right lower thoracic region.  He states that he has had these in the past with good success.  He understands the risks and benefits of the procedure as discussed with him in detail.  Should he have any shortness of breath following injection I have requested that he present to an emergency room for evaluation and discussed the potential for pneumothorax etc.  I want him to continue with TENS application hot and cold with stretching exercises with return to clinic scheduled in 1 month.  6. Acute right-sided thoracic back pain Procedure note: Trigger point injection: As aboveTrigger point injection: The area overlying the aforementioned trigger points were prepped with alcohol. They were then injected with a 25-gauge needle with 6 cc of  ropivacaine 0.2% and Decadron  10 mg at each site after negative aspiration for heme  Or air this was performed after informed consent was obtained and risks and benefits reviewed. She tolerated this procedure without difficulty and was convalesced and discharged to home in stable condition for follow-up as mentioned.  @Cameryn Schum  Myra, MD@    ----------------------------------------------------------------------------------------------------------------------  I am having Tom MANIFOLD. Waddell maintain his Probiotic Product (PROBIOTIC PO), buPROPion , AMBULATORY NON FORMULARY MEDICATION, pravastatin , gabapentin , tamsulosin , valsartan, acetaminophen , CoQ-10, cyclobenzaprine , PRESCRIPTION MEDICATION, GenTeal, sodium chloride , fluticasone , LORazepam , sildenafil, augmented betamethasone dipropionate, sertraline, testosterone  cypionate, Eszopiclone, and gabapentin . We administered dexamethasone .   Meds ordered this encounter  Medications   dexamethasone  (DECADRON ) injection 10 mg   ropivacaine (PF) 2 mg/mL (0.2%) (NAROPIN) injection 10 mL       Follow-up: No follow-ups on file.    Tom Wilkerson KANDICE Myra, MD 1:08 PM  The [place] practitioner database for opioid medications on this patient has been reviewed by me and my staff   Greater than 50% of the total encounter time was spent in counseling and / or coordination of care.     This dictation was performed utilizing Conservation officer, historic buildings.  Please excuse any unintentional or mistaken typographical errors as a result.
